# Patient Record
Sex: Female | Born: 1955 | Race: White | Hispanic: No | Marital: Single | Smoking: Former smoker
Health system: Southern US, Community
[De-identification: ages and names within clinical notes are randomized; demographics above are authoritative.]

## PROBLEM LIST (undated history)

## (undated) DIAGNOSIS — I219 Acute myocardial infarction, unspecified: Secondary | ICD-10-CM

## (undated) DIAGNOSIS — K219 Gastro-esophageal reflux disease without esophagitis: Secondary | ICD-10-CM

## (undated) DIAGNOSIS — I639 Cerebral infarction, unspecified: Secondary | ICD-10-CM

## (undated) DIAGNOSIS — I701 Atherosclerosis of renal artery: Secondary | ICD-10-CM

## (undated) DIAGNOSIS — I2510 Atherosclerotic heart disease of native coronary artery without angina pectoris: Secondary | ICD-10-CM

## (undated) DIAGNOSIS — I10 Essential (primary) hypertension: Secondary | ICD-10-CM

## (undated) HISTORY — PX: BREAST BIOPSY: SHX20

---

## 2010-03-18 ENCOUNTER — Emergency Department (HOSPITAL_BASED_OUTPATIENT_CLINIC_OR_DEPARTMENT_OTHER): Admission: EM | Admit: 2010-03-18 | Discharge: 2010-03-18 | Payer: Self-pay | Admitting: Emergency Medicine

## 2010-03-18 ENCOUNTER — Ambulatory Visit: Payer: Self-pay | Admitting: Interventional Radiology

## 2010-04-10 ENCOUNTER — Ambulatory Visit: Payer: Self-pay | Admitting: Diagnostic Radiology

## 2010-04-10 ENCOUNTER — Inpatient Hospital Stay (HOSPITAL_COMMUNITY): Admission: EM | Admit: 2010-04-10 | Discharge: 2010-04-12 | Payer: Self-pay | Admitting: Internal Medicine

## 2010-04-10 ENCOUNTER — Encounter: Payer: Self-pay | Admitting: Emergency Medicine

## 2010-04-10 ENCOUNTER — Ambulatory Visit: Payer: Self-pay | Admitting: Cardiology

## 2010-07-17 ENCOUNTER — Emergency Department (HOSPITAL_BASED_OUTPATIENT_CLINIC_OR_DEPARTMENT_OTHER)
Admission: EM | Admit: 2010-07-17 | Discharge: 2010-07-17 | Payer: Self-pay | Source: Home / Self Care | Admitting: Emergency Medicine

## 2010-08-30 LAB — LIPID PANEL
Cholesterol: 164 mg/dL (ref 0–200)
HDL: 55 mg/dL (ref >39)
LDL Cholesterol: 85 mg/dL (ref 0–99)
Total CHOL/HDL Ratio: 3 RATIO
Triglycerides: 122 mg/dL (ref <150)
VLDL: 24 mg/dL (ref 0–40)

## 2010-08-30 LAB — COMPREHENSIVE METABOLIC PANEL WITH GFR
ALT: 19 U/L (ref 0–35)
AST: 20 U/L (ref 0–37)
Albumin: 4.4 g/dL (ref 3.5–5.2)
Alkaline Phosphatase: 86 U/L (ref 39–117)
BUN: 24 mg/dL — ABNORMAL HIGH (ref 6–23)
CO2: 26 meq/L (ref 19–32)
Calcium: 10 mg/dL (ref 8.4–10.5)
Chloride: 104 meq/L (ref 96–112)
Creatinine, Ser: 1.1 mg/dL (ref 0.4–1.2)
GFR calc non Af Amer: 52 mL/min — ABNORMAL LOW
Glucose, Bld: 91 mg/dL (ref 70–99)
Potassium: 4.3 meq/L (ref 3.5–5.1)
Sodium: 142 meq/L (ref 135–145)
Total Bilirubin: 0.6 mg/dL (ref 0.3–1.2)
Total Protein: 7.1 g/dL (ref 6.0–8.3)

## 2010-08-30 LAB — BASIC METABOLIC PANEL
BUN: 24 mg/dL — ABNORMAL HIGH (ref 6–23)
CO2: 26 mEq/L (ref 19–32)
Calcium: 9.6 mg/dL (ref 8.4–10.5)
Chloride: 104 mEq/L (ref 96–112)
Creatinine, Ser: 1.16 mg/dL (ref 0.4–1.2)
GFR calc Af Amer: 59 mL/min — ABNORMAL LOW (ref >60)
GFR calc non Af Amer: 49 mL/min — ABNORMAL LOW (ref >60)
Glucose, Bld: 101 mg/dL — ABNORMAL HIGH (ref 70–99)
Potassium: 3.9 mEq/L (ref 3.5–5.1)
Sodium: 140 mEq/L (ref 135–145)

## 2010-08-30 LAB — CARDIAC PANEL(CRET KIN+CKTOT+MB+TROPI)
CK, MB: 0.8 ng/mL (ref 0.3–4.0)
CK, MB: 0.8 ng/mL (ref 0.3–4.0)
CK, MB: 0.8 ng/mL (ref 0.3–4.0)
Relative Index: INVALID (ref 0.0–2.5)
Relative Index: INVALID (ref 0.0–2.5)
Relative Index: INVALID (ref 0.0–2.5)
Total CK: 35 U/L (ref 7–177)
Total CK: 39 U/L (ref 7–177)
Total CK: 41 U/L (ref 7–177)
Troponin I: <0.01 ng/mL (ref 0.00–0.06)
Troponin I: <0.01 ng/mL (ref 0.00–0.06)

## 2010-08-30 LAB — POCT CARDIAC MARKERS
CKMB, poc: <1 ng/mL — ABNORMAL LOW (ref 1.0–8.0)
Myoglobin, poc: 43.3 ng/mL (ref 12–200)
Troponin i, poc: <0.05 ng/mL (ref 0.00–0.09)

## 2010-08-30 LAB — LIPASE, BLOOD: Lipase: 113 U/L (ref 23–300)

## 2010-08-30 LAB — CBC
HCT: 36.6 % (ref 36.0–46.0)
HCT: 38.5 % (ref 36.0–46.0)
Hemoglobin: 12.2 g/dL (ref 12.0–15.0)
Hemoglobin: 12.8 g/dL (ref 12.0–15.0)
MCH: 29.3 pg (ref 26.0–34.0)
MCH: 30.5 pg (ref 26.0–34.0)
MCHC: 33.3 g/dL (ref 30.0–36.0)
MCHC: 33.3 g/dL (ref 30.0–36.0)
MCV: 87.8 fL (ref 78.0–100.0)
MCV: 91.6 fL (ref 78.0–100.0)
Platelets: 163 10*3/uL (ref 150–400)
Platelets: 164 10*3/uL (ref 150–400)
RBC: 4.17 MIL/uL (ref 3.87–5.11)
RBC: 4.2 MIL/uL (ref 3.87–5.11)
RDW: 12.1 % (ref 11.5–15.5)
RDW: 12.7 % (ref 11.5–15.5)
WBC: 5.4 10*3/uL (ref 4.0–10.5)
WBC: 6.1 10*3/uL (ref 4.0–10.5)

## 2010-08-30 LAB — DIFFERENTIAL
Basophils Absolute: 0 10*3/uL (ref 0.0–0.1)
Basophils Relative: 1 % (ref 0–1)
Eosinophils Absolute: 0.1 10*3/uL (ref 0.0–0.7)
Eosinophils Relative: 2 % (ref 0–5)
Lymphocytes Relative: 38 % (ref 12–46)
Lymphs Abs: 2.1 10*3/uL (ref 0.7–4.0)
Monocytes Absolute: 0.2 10*3/uL (ref 0.1–1.0)
Monocytes Relative: 4 % (ref 3–12)
Neutro Abs: 3 10*3/uL (ref 1.7–7.7)
Neutrophils Relative %: 55 % (ref 43–77)

## 2010-08-30 LAB — HEMOGLOBIN A1C
Hgb A1c MFr Bld: 5.6 % (ref <5.7)
Mean Plasma Glucose: 114 mg/dL (ref <117)

## 2010-08-30 LAB — MRSA PCR SCREENING: MRSA by PCR: NEGATIVE

## 2010-08-31 LAB — DIFFERENTIAL
Basophils Absolute: 0 10*3/uL (ref 0.0–0.1)
Basophils Relative: 0 % (ref 0–1)
Eosinophils Absolute: 0.2 10*3/uL (ref 0.0–0.7)
Eosinophils Relative: 4 % (ref 0–5)
Lymphocytes Relative: 25 % (ref 12–46)
Lymphs Abs: 1.4 10*3/uL (ref 0.7–4.0)
Monocytes Absolute: 0.3 10*3/uL (ref 0.1–1.0)
Monocytes Relative: 6 % (ref 3–12)
Neutro Abs: 3.6 10*3/uL (ref 1.7–7.7)
Neutrophils Relative %: 65 % (ref 43–77)

## 2010-08-31 LAB — BASIC METABOLIC PANEL
BUN: 19 mg/dL (ref 6–23)
CO2: 29 mEq/L (ref 19–32)
Calcium: 10.1 mg/dL (ref 8.4–10.5)
Chloride: 105 mEq/L (ref 96–112)
Creatinine, Ser: 1 mg/dL (ref 0.4–1.2)
GFR calc Af Amer: >60 mL/min (ref >60)
GFR calc non Af Amer: 58 mL/min — ABNORMAL LOW (ref >60)
Glucose, Bld: 135 mg/dL — ABNORMAL HIGH (ref 70–99)
Potassium: 4.3 mEq/L (ref 3.5–5.1)
Sodium: 142 mEq/L (ref 135–145)

## 2010-08-31 LAB — CBC
HCT: 39.2 % (ref 36.0–46.0)
Hemoglobin: 13.6 g/dL (ref 12.0–15.0)
MCH: 30.8 pg (ref 26.0–34.0)
MCHC: 34.6 g/dL (ref 30.0–36.0)
MCV: 88.9 fL (ref 78.0–100.0)
Platelets: 167 10*3/uL (ref 150–400)
RBC: 4.41 MIL/uL (ref 3.87–5.11)
RDW: 12.3 % (ref 11.5–15.5)
WBC: 5.5 10*3/uL (ref 4.0–10.5)

## 2010-10-28 ENCOUNTER — Emergency Department (HOSPITAL_BASED_OUTPATIENT_CLINIC_OR_DEPARTMENT_OTHER)
Admission: EM | Admit: 2010-10-28 | Discharge: 2010-10-28 | Disposition: A | Discharge status: Home / Self Care | Payer: Medicaid Other | Attending: Emergency Medicine | Admitting: Emergency Medicine

## 2010-10-28 ENCOUNTER — Emergency Department (INDEPENDENT_AMBULATORY_CARE_PROVIDER_SITE_OTHER): Payer: Medicaid Other

## 2010-10-28 DIAGNOSIS — K219 Gastro-esophageal reflux disease without esophagitis: Secondary | ICD-10-CM | POA: Insufficient documentation

## 2010-10-28 DIAGNOSIS — M7989 Other specified soft tissue disorders: Secondary | ICD-10-CM

## 2010-10-28 DIAGNOSIS — M79609 Pain in unspecified limb: Secondary | ICD-10-CM

## 2010-10-28 DIAGNOSIS — Z79899 Other long term (current) drug therapy: Secondary | ICD-10-CM | POA: Insufficient documentation

## 2010-10-28 DIAGNOSIS — I2510 Atherosclerotic heart disease of native coronary artery without angina pectoris: Secondary | ICD-10-CM | POA: Insufficient documentation

## 2010-10-28 DIAGNOSIS — M949 Disorder of cartilage, unspecified: Secondary | ICD-10-CM

## 2010-10-28 DIAGNOSIS — L02519 Cutaneous abscess of unspecified hand: Secondary | ICD-10-CM | POA: Insufficient documentation

## 2010-11-05 ENCOUNTER — Emergency Department (INDEPENDENT_AMBULATORY_CARE_PROVIDER_SITE_OTHER): Payer: Medicaid Other

## 2010-11-05 ENCOUNTER — Emergency Department (HOSPITAL_BASED_OUTPATIENT_CLINIC_OR_DEPARTMENT_OTHER)
Admission: EM | Admit: 2010-11-05 | Discharge: 2010-11-05 | Disposition: A | Discharge status: Home / Self Care | Payer: Medicaid Other | Attending: Emergency Medicine | Admitting: Emergency Medicine

## 2010-11-05 DIAGNOSIS — Z8679 Personal history of other diseases of the circulatory system: Secondary | ICD-10-CM | POA: Insufficient documentation

## 2010-11-05 DIAGNOSIS — K219 Gastro-esophageal reflux disease without esophagitis: Secondary | ICD-10-CM | POA: Insufficient documentation

## 2010-11-05 DIAGNOSIS — M7989 Other specified soft tissue disorders: Secondary | ICD-10-CM

## 2010-11-05 DIAGNOSIS — L03119 Cellulitis of unspecified part of limb: Secondary | ICD-10-CM | POA: Insufficient documentation

## 2010-11-05 DIAGNOSIS — I2510 Atherosclerotic heart disease of native coronary artery without angina pectoris: Secondary | ICD-10-CM | POA: Insufficient documentation

## 2010-11-05 DIAGNOSIS — L02519 Cutaneous abscess of unspecified hand: Secondary | ICD-10-CM | POA: Insufficient documentation

## 2010-11-05 DIAGNOSIS — Z79899 Other long term (current) drug therapy: Secondary | ICD-10-CM | POA: Insufficient documentation

## 2010-11-05 DIAGNOSIS — M79609 Pain in unspecified limb: Secondary | ICD-10-CM

## 2010-11-05 LAB — CBC
HCT: 35.9 % — ABNORMAL LOW (ref 36.0–46.0)
Hemoglobin: 11.8 g/dL — ABNORMAL LOW (ref 12.0–15.0)
MCH: 28 pg (ref 26.0–34.0)
MCHC: 32.9 g/dL (ref 30.0–36.0)
MCV: 85.3 fL (ref 78.0–100.0)
Platelets: 189 10*3/uL (ref 150–400)
RBC: 4.21 MIL/uL (ref 3.87–5.11)
RDW: 13 % (ref 11.5–15.5)
WBC: 6.2 10*3/uL (ref 4.0–10.5)

## 2010-11-05 LAB — DIFFERENTIAL
Basophils Relative: 0 % (ref 0–1)
Eosinophils Absolute: 0.1 10*3/uL (ref 0.0–0.7)
Eosinophils Relative: 2 % (ref 0–5)
Lymphocytes Relative: 27 % (ref 12–46)
Lymphs Abs: 1.6 10*3/uL (ref 0.7–4.0)
Monocytes Absolute: 0.4 10*3/uL (ref 0.1–1.0)
Monocytes Relative: 6 % (ref 3–12)
Neutro Abs: 4 10*3/uL (ref 1.7–7.7)

## 2010-11-05 LAB — BASIC METABOLIC PANEL
BUN: 12 mg/dL (ref 6–23)
CO2: 28 mEq/L (ref 19–32)
Calcium: 10.4 mg/dL (ref 8.4–10.5)
Chloride: 101 mEq/L (ref 96–112)
Creatinine, Ser: 0.8 mg/dL (ref 0.4–1.2)
GFR calc Af Amer: >60 mL/min (ref >60)
GFR calc non Af Amer: >60 mL/min (ref >60)
Glucose, Bld: 94 mg/dL (ref 70–99)
Potassium: 4 mEq/L (ref 3.5–5.1)
Sodium: 140 mEq/L (ref 135–145)

## 2011-01-02 ENCOUNTER — Encounter: Payer: Self-pay | Admitting: *Deleted

## 2011-01-02 ENCOUNTER — Emergency Department (HOSPITAL_BASED_OUTPATIENT_CLINIC_OR_DEPARTMENT_OTHER)
Admission: EM | Admit: 2011-01-02 | Discharge: 2011-01-02 | Disposition: A | Discharge status: Other Acute Inpatient Hospital | Payer: Medicaid Other | Attending: Emergency Medicine | Admitting: Emergency Medicine

## 2011-01-02 DIAGNOSIS — I10 Essential (primary) hypertension: Secondary | ICD-10-CM | POA: Insufficient documentation

## 2011-01-02 DIAGNOSIS — R5383 Other fatigue: Secondary | ICD-10-CM | POA: Insufficient documentation

## 2011-01-02 DIAGNOSIS — K219 Gastro-esophageal reflux disease without esophagitis: Secondary | ICD-10-CM | POA: Insufficient documentation

## 2011-01-02 DIAGNOSIS — R5381 Other malaise: Secondary | ICD-10-CM | POA: Insufficient documentation

## 2011-01-02 DIAGNOSIS — I2510 Atherosclerotic heart disease of native coronary artery without angina pectoris: Secondary | ICD-10-CM | POA: Insufficient documentation

## 2011-01-02 DIAGNOSIS — I959 Hypotension, unspecified: Secondary | ICD-10-CM | POA: Insufficient documentation

## 2011-01-02 DIAGNOSIS — Z8673 Personal history of transient ischemic attack (TIA), and cerebral infarction without residual deficits: Secondary | ICD-10-CM | POA: Insufficient documentation

## 2011-01-02 HISTORY — DX: Essential (primary) hypertension: I10

## 2011-01-02 HISTORY — DX: Gastro-esophageal reflux disease without esophagitis: K21.9

## 2011-01-02 HISTORY — DX: Atherosclerotic heart disease of native coronary artery without angina pectoris: I25.10

## 2011-01-02 HISTORY — DX: Cerebral infarction, unspecified: I63.9

## 2011-01-02 LAB — CBC
HCT: 34.8 % — ABNORMAL LOW (ref 36.0–46.0)
MCHC: 33 g/dL (ref 30.0–36.0)
MCV: 84.5 fL (ref 78.0–100.0)
RDW: 14.4 % (ref 11.5–15.5)

## 2011-01-02 LAB — BASIC METABOLIC PANEL
BUN: 23 mg/dL (ref 6–23)
Creatinine, Ser: 1.3 mg/dL — ABNORMAL HIGH (ref 0.50–1.10)
GFR calc Af Amer: 51 mL/min — ABNORMAL LOW (ref >60)
GFR calc non Af Amer: 43 mL/min — ABNORMAL LOW (ref >60)

## 2011-01-02 LAB — URINALYSIS, ROUTINE W REFLEX MICROSCOPIC
Bilirubin Urine: NEGATIVE
Ketones, ur: NEGATIVE mg/dL
Nitrite: NEGATIVE
Protein, ur: NEGATIVE mg/dL
Urobilinogen, UA: 1 mg/dL (ref 0.0–1.0)

## 2011-01-02 NOTE — ED Provider Notes (Signed)
History     Chief Complaint  Patient presents with  . Fatigue   Patient is a 55 y.o. female presenting with general illness. The history is provided by the patient, the nursing home and the EMS personnel.  Illness  The current episode started today. The onset was gradual. The problem occurs rarely. The problem has been resolved. The problem is mild. The symptoms are relieved by nothing. The symptoms are aggravated by nothing. Associated symptoms include vomiting. Pertinent negatives include no fever, no decreased vision, no double vision, no abdominal pain, no nausea, no headaches, no muscle aches and no neck pain. She has been behaving normally. She has been eating and drinking normally.  Pt reports she felt bad and the Nurse at the Nursing home took her blood pressure and told her it was low.  Pt was sent here for evaluation.  Pt reports she feels normal now.  Blood pressure normal here.  Past Medical History  Diagnosis Date  . Coronary artery disease   . Stroke   . GERD (gastroesophageal reflux disease)   . Hypertension     History reviewed. No pertinent past surgical history.  History reviewed. No pertinent family history.  History  Substance Use Topics  . Smoking status: Never Smoker   . Smokeless tobacco: Not on file  . Alcohol Use: No    OB History    Grav Para Term Preterm Abortions TAB SAB Ect Mult Living                  Review of Systems  Constitutional: Negative for fever.  HENT: Negative for neck pain.   Eyes: Negative for double vision.  Gastrointestinal: Positive for vomiting. Negative for nausea and abdominal pain.  Neurological: Negative for headaches.  All other systems reviewed and are negative.    Physical Exam  BP 119/74  Pulse 70  Temp(Src) 98.8 F (37.1 C) (Oral)  Resp 24  SpO2 95%  Physical Exam  Constitutional: She is oriented to person, place, and time. She appears well-developed and well-nourished.  HENT:  Head: Normocephalic.    Eyes: Conjunctivae and EOM are normal. Pupils are equal, round, and reactive to light.  Neck: Normal range of motion. Neck supple.  Cardiovascular: Normal rate.   Pulmonary/Chest: Effort normal and breath sounds normal.  Abdominal: Soft.  Neurological: She is oriented to person, place, and time. No cranial nerve deficit.  Skin: Skin is warm.  Psychiatric: She has a normal mood and affect.    ED Course  Procedures  MDM Creat is slightly elevated, hemoglobin 11.8.  Pt's blood pressure normal here.  I advised have RN monitor blood pressure,  Recheck with primary Md in 2-3 days.      Langston Masker, [place] 01/02/11 1940  Langston Masker, [place] 01/02/11 1942  Langston Masker, [place] 01/02/11 2029

## 2011-01-02 NOTE — ED Notes (Signed)
Attempted several times to [facility] to give report , no answer

## 2011-01-02 NOTE — ED Notes (Signed)
Pt sent from [facility] for eval weakness ? Hypotension.

## 2011-01-02 NOTE — ED Notes (Signed)
Urine specimen obtained and placed in lab if needed.

## 2011-01-04 NOTE — ED Provider Notes (Signed)
Evaluation and management procedures were performed by the PA/NP under my supervision/collaboration.   Sharalee Witman, MD 01/04/11 1505 

## 2011-05-05 ENCOUNTER — Emergency Department (INDEPENDENT_AMBULATORY_CARE_PROVIDER_SITE_OTHER): Payer: Medicare Other

## 2011-05-05 ENCOUNTER — Encounter (HOSPITAL_BASED_OUTPATIENT_CLINIC_OR_DEPARTMENT_OTHER): Payer: Self-pay | Admitting: *Deleted

## 2011-05-05 ENCOUNTER — Emergency Department (HOSPITAL_BASED_OUTPATIENT_CLINIC_OR_DEPARTMENT_OTHER)
Admission: EM | Admit: 2011-05-05 | Discharge: 2011-05-05 | Disposition: A | Discharge status: Home / Self Care | Payer: Medicare Other | Attending: Emergency Medicine | Admitting: Emergency Medicine

## 2011-05-05 DIAGNOSIS — R05 Cough: Secondary | ICD-10-CM

## 2011-05-05 DIAGNOSIS — I10 Essential (primary) hypertension: Secondary | ICD-10-CM | POA: Insufficient documentation

## 2011-05-05 DIAGNOSIS — R0989 Other specified symptoms and signs involving the circulatory and respiratory systems: Secondary | ICD-10-CM

## 2011-05-05 DIAGNOSIS — I2510 Atherosclerotic heart disease of native coronary artery without angina pectoris: Secondary | ICD-10-CM | POA: Insufficient documentation

## 2011-05-05 DIAGNOSIS — Z8679 Personal history of other diseases of the circulatory system: Secondary | ICD-10-CM | POA: Insufficient documentation

## 2011-05-05 DIAGNOSIS — J40 Bronchitis, not specified as acute or chronic: Secondary | ICD-10-CM | POA: Insufficient documentation

## 2011-05-05 DIAGNOSIS — Z79899 Other long term (current) drug therapy: Secondary | ICD-10-CM | POA: Insufficient documentation

## 2011-05-05 HISTORY — DX: Acute myocardial infarction, unspecified: I21.9

## 2011-05-05 HISTORY — DX: Atherosclerosis of renal artery: I70.1

## 2011-05-05 MED ORDER — ALBUTEROL SULFATE HFA 108 (90 BASE) MCG/ACT IN AERS
2.0000 | INHALATION_SPRAY | Freq: Four times a day (QID) | RESPIRATORY_TRACT | Status: DC | PRN
  Filled 2011-05-05: qty 6.7

## 2011-05-05 MED ORDER — IPRATROPIUM BROMIDE 0.02 % IN SOLN
0.5000 mg | Freq: Once | RESPIRATORY_TRACT | Status: AC
  Administered 2011-05-05: 0.5 mg via RESPIRATORY_TRACT
  Filled 2011-05-05: qty 2.5

## 2011-05-05 MED ORDER — MOXIFLOXACIN HCL 400 MG PO TABS: 400.0000 mg | ORAL_TABLET | Freq: Every day | ORAL | Status: AC

## 2011-05-05 MED ORDER — ALBUTEROL SULFATE (5 MG/ML) 0.5% IN NEBU
2.5000 mg | INHALATION_SOLUTION | Freq: Once | RESPIRATORY_TRACT | Status: AC
  Administered 2011-05-05: 2.5 mg via RESPIRATORY_TRACT
  Filled 2011-05-05: qty 0.5

## 2011-05-05 NOTE — ED Notes (Signed)
Pt describes cough and congestion x 2 days. BBS diminished. Mild SOB.

## 2011-05-05 NOTE — ED Provider Notes (Signed)
Medical screening examination/treatment/procedure(s) were performed by non-physician practitioner and as supervising physician I was immediately available for consultation/collaboration.    Celene Kras, MD 05/05/11 [phone]

## 2011-05-05 NOTE — ED Provider Notes (Signed)
History     CSN: [id number] Arrival date & time: 05/05/2011  2:15 PM   First MD Initiated Contact with Patient 05/05/11 1421      Chief Complaint  Patient presents with  . Cough    (Consider location/radiation/quality/duration/timing/severity/associated sxs/prior treatment) Patient is a 55 y.o. female presenting with cough and shortness of breath. The history is provided by the patient and a relative.  Cough This is a chronic problem. The current episode started 2 days ago. The problem occurs constantly. The problem has been gradually worsening. The cough is non-productive. Associated symptoms include shortness of breath. Pertinent negatives include no chest pain, no rhinorrhea, no sore throat and no wheezing. Associated symptoms comments: Back pain from coughing.. She has tried nothing for the symptoms. She is a smoker.  Shortness of Breath  The current episode started 2 days ago. The onset was gradual. The problem occurs frequently. The problem has been gradually worsening. The problem is moderate. The symptoms are relieved by nothing. The symptoms are aggravated by nothing. Associated symptoms include cough and shortness of breath. Pertinent negatives include no chest pain, no chest pressure, no orthopnea, no fever, no rhinorrhea, no sore throat, no stridor and no wheezing. She is currently using steroids. She has received no recent medical care.    Past Medical History  Diagnosis Date  . Coronary artery disease   . Stroke   . GERD (gastroesophageal reflux disease)   . Hypertension     History reviewed. No pertinent past surgical history.  No family history on file.  History  Substance Use Topics  . Smoking status: Never Smoker   . Smokeless tobacco: Not on file  . Alcohol Use: No    OB History    Grav Para Term Preterm Abortions TAB SAB Ect Mult Living                  Review of Systems  Constitutional: Negative.  Negative for fever.  HENT: Positive for  congestion. Negative for sore throat and rhinorrhea.   Eyes: Negative.   Respiratory: Positive for cough and shortness of breath. Negative for wheezing and stridor.   Cardiovascular: Negative.  Negative for chest pain and orthopnea.  Gastrointestinal: Negative.   Genitourinary: Negative.   Musculoskeletal: Positive for back pain.  Neurological: Positive for speech difficulty.       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  Hematological: Negative.   Psychiatric/Behavioral: Negative.     Allergies  Review of patient's allergies indicates no known allergies.  Home Medications   Current Outpatient Rx  Name Route Sig Dispense Refill  . ACETAMINOPHEN 325 MG PO TABS Oral Take 650 mg by mouth every 8 (eight) hours as needed. Pain/fever     . ALUM & MAG HYDROXIDE-SIMETH 500-450-40 MG/5ML PO SUSP Oral Take by mouth every 6 (six) hours as needed.      Marland Kitchen ALUMINUM-MAGNESIUM-SIMETHICONE 200-200-20 MG/5ML PO SUSP Oral Take 10-20 mLs by mouth 4 (four) times daily -  before meals and at bedtime. indigestion     . CARVEDILOL 3.125 MG PO TABS Oral Take 3.125 mg by mouth 2 (two) times daily with a meal.      . CLOPIDOGREL BISULFATE 75 MG PO TABS Oral Take 75 mg by mouth daily.      Marland Kitchen HYDROCODONE-ACETAMINOPHEN 5-325 MG PO TABS Oral Take 1 tablet by mouth every 4 (four) hours as needed. pain    . LISINOPRIL 20 MG PO TABS Oral Take 20 mg by  mouth daily.      . MULTI-VITAMIN/MINERALS PO TABS Oral Take 1 tablet by mouth daily.      Marland Kitchen PANTOPRAZOLE SODIUM 40 MG PO TBEC Oral Take 40 mg by mouth daily.      Marland Kitchen POTASSIUM CHLORIDE 10 MEQ PO TBCR Oral Take 10 mEq by mouth daily.      Marland Kitchen PRAVASTATIN SODIUM 40 MG PO TABS Oral Take 40 mg by mouth daily.      . SERTRALINE HCL 50 MG PO TABS Oral Take 50 mg by mouth daily.        BP 160/76  Pulse 94  Temp(Src) 97.8 F (36.6 C) (Oral)  Resp 20  Ht 5\' 2"  (1.575 m)  Wt 133 lb (60.328 kg)  BMI 24.33 kg/m2  SpO2 100%  Physical Exam    Constitutional: She is oriented to person, place, and time. She appears well-developed and well-nourished.  HENT:  Head: Normocephalic and atraumatic.  Eyes: Conjunctivae and EOM are normal. Pupils are equal, round, and reactive to light.  Neck: Normal range of motion. Neck supple.  Cardiovascular: Normal rate, regular rhythm and normal heart sounds.   Pulmonary/Chest: Effort normal. No respiratory distress. She has no wheezes. She has no rales. She exhibits no tenderness.       Dec. BS throughout bilateral bases.  Abdominal: Soft. Bowel sounds are normal. She exhibits no distension. There is no tenderness.  Musculoskeletal: Normal range of motion.  Lymphadenopathy:    She has no cervical adenopathy.  Neurological: She is alert and oriented to person, place, and time.       Difficulty with speech, residual effect of two previous strokes.  Skin: Skin is warm and dry. No rash noted. No erythema. No pallor.  Psychiatric: She has a normal mood and affect. Her behavior is normal.    ED Course  Procedures (including critical care time)  Labs Reviewed - No data to display No results found.   No diagnosis found.  I've reviewed CXR with patient showing right sided airspace disease.  Patient sob improving with albuterol/atrovent nebulizer.  No chest pain.  Continued persistent cough.  MDM  Pt c/o cough and nasal congestion.  She denies any fever or chest pain.  She has extensive cardiac history.  She states this sx are different.  She is currently on prednisone but her sister is unsure why.  She unfortunately smokes >1/2 ppd.  Treat with albuterol inhaler for cough and Avelox for bronchitis.  If symptoms worsen or persist, advised to return to ED.  Pt will f/u with pcp next week for recheck.      Rodena Medin, PA 05/05/11 [phone]

## 2016-07-19 HISTORY — PX: JOINT REPLACEMENT: SHX530

## 2016-11-10 ENCOUNTER — Emergency Department (HOSPITAL_COMMUNITY)
Admission: EM | Admit: 2016-11-10 | Discharge: 2016-11-11 | Disposition: A | Discharge status: Home / Self Care | Payer: Medicare Other | Attending: Emergency Medicine | Admitting: Emergency Medicine

## 2016-11-10 ENCOUNTER — Encounter (HOSPITAL_COMMUNITY): Payer: Self-pay

## 2016-11-10 ENCOUNTER — Emergency Department (HOSPITAL_COMMUNITY): Payer: Medicare Other

## 2016-11-10 DIAGNOSIS — I2510 Atherosclerotic heart disease of native coronary artery without angina pectoris: Secondary | ICD-10-CM | POA: Diagnosis not present

## 2016-11-10 DIAGNOSIS — I639 Cerebral infarction, unspecified: Secondary | ICD-10-CM | POA: Insufficient documentation

## 2016-11-10 DIAGNOSIS — I219 Acute myocardial infarction, unspecified: Secondary | ICD-10-CM | POA: Diagnosis not present

## 2016-11-10 DIAGNOSIS — Y999 Unspecified external cause status: Secondary | ICD-10-CM | POA: Insufficient documentation

## 2016-11-10 DIAGNOSIS — Z7902 Long term (current) use of antithrombotics/antiplatelets: Secondary | ICD-10-CM | POA: Insufficient documentation

## 2016-11-10 DIAGNOSIS — Y929 Unspecified place or not applicable: Secondary | ICD-10-CM | POA: Insufficient documentation

## 2016-11-10 DIAGNOSIS — Z79899 Other long term (current) drug therapy: Secondary | ICD-10-CM | POA: Diagnosis not present

## 2016-11-10 DIAGNOSIS — Y939 Activity, unspecified: Secondary | ICD-10-CM | POA: Insufficient documentation

## 2016-11-10 DIAGNOSIS — I10 Essential (primary) hypertension: Secondary | ICD-10-CM | POA: Diagnosis not present

## 2016-11-10 DIAGNOSIS — F17200 Nicotine dependence, unspecified, uncomplicated: Secondary | ICD-10-CM | POA: Insufficient documentation

## 2016-11-10 DIAGNOSIS — S62111A Displaced fracture of triquetrum [cuneiform] bone, right wrist, initial encounter for closed fracture: Secondary | ICD-10-CM

## 2016-11-10 DIAGNOSIS — W010XXA Fall on same level from slipping, tripping and stumbling without subsequent striking against object, initial encounter: Secondary | ICD-10-CM | POA: Diagnosis not present

## 2016-11-10 DIAGNOSIS — S6991XA Unspecified injury of right wrist, hand and finger(s), initial encounter: Secondary | ICD-10-CM | POA: Diagnosis present

## 2016-11-10 DIAGNOSIS — S62112A Displaced fracture of triquetrum [cuneiform] bone, left wrist, initial encounter for closed fracture: Secondary | ICD-10-CM | POA: Insufficient documentation

## 2016-11-10 NOTE — ED Triage Notes (Signed)
Pt from [facility] with [facility] c.o witnessed fall. Pt was walking with walker when she tripped and fell on her right arm. No LOC and did not hit her head. Old bruising noted to right hand, pt c.o pain 8/10. Pt a.o, VSS nad

## 2016-11-10 NOTE — ED Notes (Signed)
Patient transported to X-ray 

## 2016-11-10 NOTE — ED Provider Notes (Signed)
[facility]-EMERGENCY DEPT Provider Note   CSN: [id number] Arrival date & time: 11/10/16  2255     History   Chief Complaint Chief Complaint  Patient presents with  . Fall    HPI Sandra Rice is a 61 y.o. female.  HPI Patient presents to the emergency room for evaluation of a right hand injury. Patient states she fell today injuring her right hand. She did not hit her head or lose consciousness. She denies any other injuries.  Patient denies any numbness or weakness. Past Medical History:  Diagnosis Date  . Coronary artery disease   . GERD (gastroesophageal reflux disease)   . Hypertension   . Myocardial infarction (HCC)   . Renal artery stenosis (HCC)   . Stroke [facility])     There are no active problems to display for this patient.   History reviewed. No pertinent surgical history.  OB History    No data available       Home Medications    Prior to Admission medications   Medication Sig Start Date End Date Taking? Authorizing Provider  acetaminophen (TYLENOL) 325 MG tablet Take 650 mg by mouth every 8 (eight) hours as needed. Pain/fever     [provider]  aluminum & magnesium hydroxide-simethicone (MYLANTA) 500-450-40 MG/5ML suspension Take by mouth every 6 (six) hours as needed.      [provider]  aluminum-magnesium hydroxide-simethicone (MAALOX) 200-200-20 MG/5ML SUSP Take 10-20 mLs by mouth 4 (four) times daily -  before meals and at bedtime. indigestion     [provider]  carvedilol (COREG) 3.125 MG tablet Take 3.125 mg by mouth 2 (two) times daily with a meal.      [provider]  clopidogrel (PLAVIX) 75 MG tablet Take 75 mg by mouth daily.      [provider]  HYDROcodone-acetaminophen (NORCO) 5-325 MG per tablet Take 1 tablet by mouth every 4 (four) hours as needed. pain    [provider]  lisinopril (PRINIVIL,ZESTRIL) 20 MG tablet Take 20 mg by mouth daily.      [provider]  Multiple  Vitamins-Minerals (MULTIVITAMIN WITH MINERALS) tablet Take 1 tablet by mouth daily.      [provider]  pantoprazole (PROTONIX) 40 MG tablet Take 40 mg by mouth daily.      [provider]  potassium chloride (KLOR-CON) 10 MEQ CR tablet Take 10 mEq by mouth daily.      [provider]  pravastatin (PRAVACHOL) 40 MG tablet Take 40 mg by mouth daily.      [provider]  sertraline (ZOLOFT) 50 MG tablet Take 50 mg by mouth daily.      [provider]    Family History No family history on file.  Social History Social History  Substance Use Topics  . Smoking status: Current Every Day Smoker    Packs/day: 0.50  . Smokeless tobacco: Not on file  . Alcohol use No     Allergies   Patient has no known allergies.   Review of Systems Review of Systems  All other systems reviewed and are negative.    Physical Exam Updated Vital Signs BP 127/69   Pulse 77   Temp 97.7 F (36.5 C) (Oral)   Resp 16   Wt 72.6 kg (160 lb)   SpO2 99%   BMI 29.26 kg/m   Physical Exam  Constitutional: She appears well-developed and well-nourished. No distress.  HENT:  Head: Normocephalic and atraumatic.  Right Ear:  External ear normal.  Left Ear: External ear normal.  Eyes: Conjunctivae are normal. Right eye exhibits no discharge. Left eye exhibits no discharge. No scleral icterus.  Neck: Neck supple. No tracheal deviation present.  Cardiovascular: Normal rate.   Pulmonary/Chest: Effort normal. No stridor. No respiratory distress.  Abdominal: She exhibits no distension.  Musculoskeletal: She exhibits no edema.       Right hand: She exhibits tenderness and bony tenderness.  Diffuse Ecchymoses noted on the dorsal aspect of the right hand, tenderness to palpation of the carpal, metacarpal and phalanges, mild tenderness to palpation of the wrist  Neurological: She is alert. Cranial nerve deficit: no gross deficits.  Skin: Skin is warm and dry. No rash  noted.  Psychiatric: She has a normal mood and affect.  Nursing note and vitals reviewed.    ED Treatments / Results  Labs (all labs ordered are listed, but only abnormal results are displayed) Labs Reviewed - No data to display  EKG  EKG Interpretation None       Radiology Dg Wrist Complete Right  Result Date: 11/11/2016 CLINICAL DATA:  Pt states she shut her right hand in the bathroom door "several days ago", then fell today. Right hand and wrist pain, swelling and bruising. EXAM: RIGHT WRIST - COMPLETE 3+ VIEW COMPARISON:  None. FINDINGS: Diffuse bone demineralization. Degenerative changes in the radiocarpal and STT joints. Bone fragment over the dorsum of the right wrist likely representing a triquetrum fracture. Dorsal soft tissue swelling. Vascular calcifications. IMPRESSION: Bone fragment over the dorsum of the wrist likely representing a triquetrum fracture. Soft tissue swelling. Electronically Signed   By: Burman Nieves M.D.   On: 11/11/2016 00:30   Dg Hand Complete Right  Result Date: 11/11/2016 CLINICAL DATA:  Pt states she shut her right hand in the bathroom door "several days ago", then fell today. Right hand and wrist pain, swelling and bruising. EXAM: RIGHT HAND - COMPLETE 3+ VIEW COMPARISON:  None. FINDINGS: Diffuse bone demineralization. Mild diffuse degenerative changes in the right hand and wrist. Small cortical fragment demonstrated in the dorsum of the right wrist probably representing a triquetrum avulsion fracture. Dorsal soft tissue swelling is present. No focal bone lesion or bone destruction. Vascular calcifications. Probable old fracture deformity of the distal radius. IMPRESSION: Bone fragment in the dorsum of the right wrist likely representing a triquetrum fracture. Soft tissue swelling. Electronically Signed   By: Burman Nieves M.D.   On: 11/11/2016 00:29    Procedures Procedures (including critical care time)  Medications Ordered in ED Medications  - No data to display   Initial Impression / Assessment and Plan / ED Course  I have reviewed the triage vital signs and the nursing notes.  Pertinent labs & imaging results that were available during my care of the patient were reviewed by me and considered in my medical decision making (see chart for details).   Triquetral fracture on xray.  Will place in volar splint.  Splint by ortho tech.  Follow up with   Final Clinical Impressions(s) / ED Diagnoses   Final diagnoses:  Closed chip fracture of triquetrum of right wrist, initial encounter    New Prescriptions Discharge Medication List as of 11/11/2016  1:46 AM       Linwood Dibbles, MD 11/11/16 [phone]

## 2016-11-11 NOTE — ED Notes (Signed)
Contacted [facility] for tx to [facility]

## 2016-11-11 NOTE — ED Notes (Signed)
Multiple attempts made to call report to [facility] with no answer. Charge RN also made multiple attempts to contact facility with no success. Pt to be discharged to facility by [facility]

## 2016-11-11 NOTE — ED Notes (Signed)
Ortho paged. 

## 2016-11-22 ENCOUNTER — Emergency Department (HOSPITAL_COMMUNITY): Payer: Medicare Other

## 2016-11-22 ENCOUNTER — Encounter (HOSPITAL_COMMUNITY): Payer: Self-pay | Admitting: *Deleted

## 2016-11-22 ENCOUNTER — Emergency Department (HOSPITAL_COMMUNITY)
Admission: EM | Admit: 2016-11-22 | Discharge: 2016-11-23 | Disposition: A | Discharge status: Home / Self Care | Payer: Medicare Other | Attending: Emergency Medicine | Admitting: Emergency Medicine

## 2016-11-22 DIAGNOSIS — I2510 Atherosclerotic heart disease of native coronary artery without angina pectoris: Secondary | ICD-10-CM | POA: Diagnosis not present

## 2016-11-22 DIAGNOSIS — Y9301 Activity, walking, marching and hiking: Secondary | ICD-10-CM | POA: Insufficient documentation

## 2016-11-22 DIAGNOSIS — Y929 Unspecified place or not applicable: Secondary | ICD-10-CM | POA: Diagnosis not present

## 2016-11-22 DIAGNOSIS — Y999 Unspecified external cause status: Secondary | ICD-10-CM | POA: Diagnosis not present

## 2016-11-22 DIAGNOSIS — S0181XA Laceration without foreign body of other part of head, initial encounter: Secondary | ICD-10-CM | POA: Diagnosis not present

## 2016-11-22 DIAGNOSIS — S2231XA Fracture of one rib, right side, initial encounter for closed fracture: Secondary | ICD-10-CM | POA: Diagnosis not present

## 2016-11-22 DIAGNOSIS — G9389 Other specified disorders of brain: Secondary | ICD-10-CM | POA: Diagnosis not present

## 2016-11-22 DIAGNOSIS — Z7902 Long term (current) use of antithrombotics/antiplatelets: Secondary | ICD-10-CM | POA: Diagnosis not present

## 2016-11-22 DIAGNOSIS — F17200 Nicotine dependence, unspecified, uncomplicated: Secondary | ICD-10-CM | POA: Insufficient documentation

## 2016-11-22 DIAGNOSIS — I119 Hypertensive heart disease without heart failure: Secondary | ICD-10-CM | POA: Insufficient documentation

## 2016-11-22 DIAGNOSIS — N390 Urinary tract infection, site not specified: Secondary | ICD-10-CM

## 2016-11-22 DIAGNOSIS — Z79899 Other long term (current) drug therapy: Secondary | ICD-10-CM | POA: Diagnosis not present

## 2016-11-22 DIAGNOSIS — W01198A Fall on same level from slipping, tripping and stumbling with subsequent striking against other object, initial encounter: Secondary | ICD-10-CM | POA: Insufficient documentation

## 2016-11-22 DIAGNOSIS — S299XXA Unspecified injury of thorax, initial encounter: Secondary | ICD-10-CM | POA: Diagnosis present

## 2016-11-22 LAB — URINALYSIS, ROUTINE W REFLEX MICROSCOPIC
Bilirubin Urine: NEGATIVE
Glucose, UA: NEGATIVE mg/dL
Hgb urine dipstick: NEGATIVE
KETONES UR: NEGATIVE mg/dL
NITRITE: NEGATIVE
PROTEIN: NEGATIVE mg/dL
Specific Gravity, Urine: 1.019 (ref 1.005–1.030)
pH: 5 (ref 5.0–8.0)

## 2016-11-22 LAB — CBC
HCT: 34.3 % — ABNORMAL LOW (ref 36.0–46.0)
HEMOGLOBIN: 10.7 g/dL — AB (ref 12.0–15.0)
MCH: 28.7 pg (ref 26.0–34.0)
MCHC: 31.2 g/dL (ref 30.0–36.0)
MCV: 92 fL (ref 78.0–100.0)
Platelets: 294 10*3/uL (ref 150–400)
RBC: 3.73 MIL/uL — ABNORMAL LOW (ref 3.87–5.11)
RDW: 15.9 % — AB (ref 11.5–15.5)
WBC: 8.2 10*3/uL (ref 4.0–10.5)

## 2016-11-22 LAB — COMPREHENSIVE METABOLIC PANEL
ALBUMIN: 3.3 g/dL — AB (ref 3.5–5.0)
ALK PHOS: 113 U/L (ref 38–126)
ALT: 11 U/L — ABNORMAL LOW (ref 14–54)
ANION GAP: 10 (ref 5–15)
AST: 15 U/L (ref 15–41)
BUN: 7 mg/dL (ref 6–20)
CO2: 24 mmol/L (ref 22–32)
Calcium: 9.1 mg/dL (ref 8.9–10.3)
Chloride: 104 mmol/L (ref 101–111)
Creatinine, Ser: 0.84 mg/dL (ref 0.44–1.00)
GFR calc Af Amer: >60 mL/min (ref >60)
GFR calc non Af Amer: >60 mL/min (ref >60)
GLUCOSE: 106 mg/dL — AB (ref 65–99)
POTASSIUM: 4.1 mmol/L (ref 3.5–5.1)
SODIUM: 138 mmol/L (ref 135–145)
Total Bilirubin: 0.4 mg/dL (ref 0.3–1.2)
Total Protein: 6 g/dL — ABNORMAL LOW (ref 6.5–8.1)

## 2016-11-22 LAB — LIPASE, BLOOD: Lipase: 21 U/L (ref 11–51)

## 2016-11-22 MED ORDER — LIDOCAINE-EPINEPHRINE (PF) 2 %-1:200000 IJ SOLN
10.0000 mL | Freq: Once | INTRAMUSCULAR | Status: AC
  Administered 2016-11-22: 10 mL via INTRADERMAL
  Filled 2016-11-22: qty 20

## 2016-11-22 MED ORDER — HYDROCODONE-ACETAMINOPHEN 5-325 MG PO TABS
2.0000 | ORAL_TABLET | Freq: Once | ORAL | Status: AC
  Administered 2016-11-22: 2 via ORAL
  Filled 2016-11-22: qty 2

## 2016-11-22 MED ORDER — MORPHINE SULFATE (PF) 4 MG/ML IV SOLN
4.0000 mg | Freq: Once | INTRAVENOUS | Status: AC
  Administered 2016-11-22: 4 mg via INTRAVENOUS
  Filled 2016-11-22: qty 1

## 2016-11-22 MED ORDER — IOPAMIDOL (ISOVUE-300) INJECTION 61%
75.0000 mL | Freq: Once | INTRAVENOUS | Status: AC | PRN
  Administered 2016-11-22: 75 mL via INTRAVENOUS

## 2016-11-22 MED ORDER — CEPHALEXIN 250 MG PO CAPS
500.0000 mg | ORAL_CAPSULE | Freq: Once | ORAL | Status: AC
  Administered 2016-11-22: 500 mg via ORAL
  Filled 2016-11-22: qty 2

## 2016-11-22 MED ORDER — LIDOCAINE-EPINEPHRINE-TETRACAINE (LET) SOLUTION
3.0000 mL | Freq: Once | NASAL | Status: AC
  Administered 2016-11-22: 3 mL via TOPICAL
  Filled 2016-11-22: qty 3

## 2016-11-22 NOTE — ED Triage Notes (Signed)
Pt arrived via EMS with c/c of pain to ribs/thorax and chin after falling at her home this morning. Pt denied LOC or dizziness before fall. Pt states she tripped over her walker and fell to floor. 1-inch laceration noted to her chin with minor bleeding. Pt is alert and oriented x4. Pt denies head or neck pain.

## 2016-11-22 NOTE — ED Notes (Signed)
Patient transported to CT 

## 2016-11-22 NOTE — ED Notes (Signed)
Patient transported to X-ray 

## 2016-11-22 NOTE — ED Provider Notes (Signed)
[facility]-EMERGENCY DEPT Provider Note   CSN: [id number] Arrival date & time: 11/22/16  1936     History   Chief Complaint Chief Complaint  Patient presents with  . Fall    HPI Sandra Rice is a 60 y.o. female.  HPI 61 yo female with history of hypertension, myocardial infarction, recurrent UTIs, who presents with fall. The patient states she was using her walker today when she slipped and tripped on her walker. She fell forward landing on her chest and chin. She reports immediate onset of sharp, positional, severe chest pain that is worse with any movement or deep breathing. She also sustained a laceration to her chin. She has mild chin pain but denies any tooth pain. Denies any oral bleeding. No loss of consciousness. Denies any neck pain. She is not on any blood thinners. Denies any alleviating factors other than trying to stay still.  Past Medical History:  Diagnosis Date  . Coronary artery disease   . GERD (gastroesophageal reflux disease)   . Hypertension   . Myocardial infarction (HCC)   . Renal artery stenosis (HCC)   . Stroke [facility])     There are no active problems to display for this patient.   No past surgical history on file.  OB History    No data available       Home Medications    Prior to Admission medications   Medication Sig Start Date End Date Taking? Authorizing Provider  acetaminophen (TYLENOL) 325 MG tablet Take 650 mg by mouth every 8 (eight) hours as needed. Pain/fever     [provider]  aluminum & magnesium hydroxide-simethicone (MYLANTA) 500-450-40 MG/5ML suspension Take by mouth every 6 (six) hours as needed.      [provider]  aluminum-magnesium hydroxide-simethicone (MAALOX) 200-200-20 MG/5ML SUSP Take 10-20 mLs by mouth 4 (four) times daily -  before meals and at bedtime. indigestion     [provider]  carvedilol (COREG) 3.125 MG tablet Take 3.125 mg by mouth 2 (two) times daily with a meal.      [provider]  cephALEXin (KEFLEX) 500 MG capsule Take 1 capsule (500 mg total) by mouth 3 (three) times daily. 11/23/16 11/30/16  Shaune Pollack, MD  clopidogrel (PLAVIX) 75 MG tablet Take 75 mg by mouth daily.      [provider]  HYDROcodone-acetaminophen (NORCO) 5-325 MG per tablet Take 1 tablet by mouth every 4 (four) hours as needed. pain    [provider]  lisinopril (PRINIVIL,ZESTRIL) 20 MG tablet Take 20 mg by mouth daily.      [provider]  Multiple Vitamins-Minerals (MULTIVITAMIN WITH MINERALS) tablet Take 1 tablet by mouth daily.      [provider]  oxyCODONE-acetaminophen (PERCOCET/ROXICET) 5-325 MG tablet Take 1-2 tablets by mouth every 6 (six) hours as needed for moderate pain or severe pain. 11/23/16   Shaune Pollack, MD  pantoprazole (PROTONIX) 40 MG tablet Take 40 mg by mouth daily.      [provider]  potassium chloride (KLOR-CON) 10 MEQ CR tablet Take 10 mEq by mouth daily.      [provider]  pravastatin (PRAVACHOL) 40 MG tablet Take 40 mg by mouth daily.      [provider]  sertraline (ZOLOFT) 50 MG tablet Take 50 mg by mouth daily.      [provider]    Family History No family history on file.  Social History Social History  Substance Use Topics  .  Smoking status: Current Every Day Smoker    Packs/day: 0.50  . Smokeless tobacco: Not on file  . Alcohol use No     Allergies   Patient has no known allergies.   Review of Systems Review of Systems  Constitutional: Negative for fever.  Genitourinary: Positive for dysuria.  Musculoskeletal: Positive for arthralgias.  Skin: Positive for wound.  All other systems reviewed and are negative.    Physical Exam Updated Vital Signs BP 110/71   Pulse 62   Temp 98.3 F (36.8 C) (Oral)   Resp 16   SpO2 97%   Physical Exam  Constitutional: She is oriented to person, place, and time. She appears well-developed and well-nourished.  No distress.  HENT:  Head: Normocephalic.  Approximately 2 cm curvilinear laceration at the bottom of the chin. No involvement of underlying muscle. No oral lesions or bleeding. No other significant trauma. There is mild surrounding ecchymosis.  Eyes: Conjunctivae are normal.  Neck: Neck supple.  Cardiovascular: Normal rate, regular rhythm and normal heart sounds.  Exam reveals no friction rub.   No murmur heard. Pulmonary/Chest: Effort normal and breath sounds normal. No respiratory distress. She has no wheezes. She has no rales.  Abdominal: She exhibits no distension.  Musculoskeletal: She exhibits no edema.  Neurological: She is alert and oriented to person, place, and time. She exhibits normal muscle tone.  Skin: Skin is warm. Capillary refill takes less than 2 seconds.  Psychiatric: She has a normal mood and affect.  Nursing note and vitals reviewed.    ED Treatments / Results  Labs (all labs ordered are listed, but only abnormal results are displayed) Labs Reviewed  URINALYSIS, ROUTINE W REFLEX MICROSCOPIC - Abnormal; Notable for the following:       Result Value   APPearance HAZY (*)    Leukocytes, UA MODERATE (*)    Bacteria, UA FEW (*)    Squamous Epithelial / LPF 0-5 (*)    All other components within normal limits  CBC - Abnormal; Notable for the following:    RBC 3.73 (*)    Hemoglobin 10.7 (*)    HCT 34.3 (*)    RDW 15.9 (*)    All other components within normal limits  COMPREHENSIVE METABOLIC PANEL - Abnormal; Notable for the following:    Glucose, Bld 106 (*)    Total Protein 6.0 (*)    Albumin 3.3 (*)    ALT 11 (*)    All other components within normal limits  URINE CULTURE  LIPASE, BLOOD  I-STAT CHEM 8, ED    EKG  EKG Interpretation  Date/Time:  Thursday November 22 2016 20:11:57 EDT Ventricular Rate:  61 PR Interval:  132 QRS Duration: 82 QT Interval:  462 QTC Calculation: 465 R Axis:   25 Text Interpretation:  Normal sinus rhythm RSR' or QR  pattern in V1 suggests right ventricular conduction delay Borderline ECG No significant change since last tracing Confirmed by Shaune Pollack [phone]) on 11/22/2016 10:58:27 PM       Radiology Dg Chest 2 View  Result Date: 11/22/2016 CLINICAL DATA:  Patient with chest pain status post fall. No reported loss of consciousness. Initial encounter. EXAM: CHEST  2 VIEW COMPARISON:  Chest radiograph 08/13/2016 FINDINGS: Patient is rotated. Stable cardiac and mediastinal contours. Heterogeneous opacities right lung base. No pleural effusion or pneumothorax. Old right clavicle fracture. No definite evidence for displaced rib fracture on limited views. IMPRESSION: Mild heterogeneous opacities right lung base may represent atelectasis. Aortic atherosclerosis.  Electronically Signed   By: Annia Belt M.D.   On: 11/22/2016 21:02   Ct Head Wo Contrast  Result Date: 11/22/2016 CLINICAL DATA:  Patient tripped over walker and fell. Small cuts chin. EXAM: CT HEAD WITHOUT CONTRAST CT CERVICAL SPINE WITHOUT CONTRAST TECHNIQUE: Multidetector CT imaging of the head and cervical spine was performed following the standard protocol without intravenous contrast. Multiplanar CT image reconstructions of the cervical spine were also generated. COMPARISON:  09/06/2012 MRI the brain, 09/05/2012 CT FINDINGS: CT HEAD FINDINGS Brain: Chronic stable right frontal lobe encephalomalacia from chronic right PCA infarct. Chronic small right cerebellar infarct with encephalomalacia. Moderate degree of small vessel ischemic disease of periventricular white matter with superficial and central atrophy. No acute intracranial hemorrhage, midline shift or edema. Chronic lacunar infarcts of the left caudate and right thalamus as well as bilateral basal ganglia and right pons. No intra-axial mass nor extra-axial fluid collections. Vascular: Moderate left vertebral and bilateral carotid siphon calcifications. No hyperdense vessels. Skull: Negative for  fracture or focal osseous lesions. Sinuses/Orbits: Minimal mucosal thickening of the ethmoid, sphenoid and right maxillary sinuses. Clear mastoids. Intact orbits and globes. Other: None CT CERVICAL SPINE FINDINGS Alignment: Intact craniocervical relationship and atlantodental interval. Minimal retrolisthesis of C3 on C4 and C4 on C5 by 1.4 mm each. This is likely on basis of degenerative disc disease. Skull base and vertebrae: No acute fracture. No primary bone lesion or focal pathologic process. Soft tissues and spinal canal: No prevertebral fluid or swelling. No visible canal hematoma. Disc levels: Cervical spondylitic disc space narrowing from C3 through C6 with small posterior marginal osteophytes contributing to bilateral neural foraminal narrowing. No jumped facets. No central canal stenosis. Uncovertebral joint osteoarthritis with spurring is identified on the right at C3-4 and bilaterally at C4-5 and C5-6. Upper chest: Negative. Other: Extracranial carotid arteriosclerosis is noted bilaterally. No thyroid masses. Atherosclerosis of the great vessels. IMPRESSION: 1. No acute intracranial abnormality. 2. Chronic right frontal and cerebellar infarcts with bilateral basal ganglial, right pontine, right thalamic and left caudate lacunar infarcts. 3. Cervical spondylosis without acute posttraumatic fracture or subluxation. Electronically Signed   By: Tollie Eth M.D.   On: 11/22/2016 21:08   Ct Chest W Contrast  Result Date: 11/22/2016 CLINICAL DATA:  Fall, chest trauma.  Diffuse chest pain. EXAM: CT CHEST WITH CONTRAST TECHNIQUE: Multidetector CT imaging of the chest was performed during intravenous contrast administration. CONTRAST:  82mL ISOVUE-300 IOPAMIDOL (ISOVUE-300) INJECTION 61% COMPARISON:  Radiographs earlier this day.  Chest CT 11/17/2010 FINDINGS: Cardiovascular: No acute aortic injury. Atherosclerosis of the thoracic aorta with mild tortuosity. No aneurysm. Coronary artery calcifications versus  stents. The heart is normal in size. No pericardial fluid. Mediastinum/Nodes: No mediastinal hematoma. No mediastinal or hilar adenopathy. The esophagus is decompressed. No pneumomediastinum. Prominent epicardial fat pads bilaterally. Lungs/Pleura: No pneumothorax. Dependent and linear atelectasis in both lung bases. Linear atelectasis in the paramediastinal right middle and right upper lobes. No evidence pulmonary contusion. No pleural fluid. Calcified granuloma in the right lower lobe. Upper Abdomen: No acute or traumatic abnormality. Atrophy of the upper right kidney with cysts, partially included, stable from prior exam. There is atherosclerosis of intra-abdominal vasculature. Musculoskeletal: Cortical buckling of right anterior third rib at the costochondral junction, suspicious for nondisplaced fracture. No additional acute fracture. Remote fracture of the right clavicle is only partially included on CT. The sternum is intact. No fracture or subluxation of the thoracic spine. No body wall contusion. IMPRESSION: 1. Probable nondisplaced right anterior third  rib fracture, possibly acute. No pulmonary complication. 2. Multifocal atelectasis. 3. Diffuse aortic atherosclerosis. Coronary artery calcifications versus stents. Electronically Signed   By: Rubye Oaks M.D.   On: 11/22/2016 23:54   Ct Cervical Spine Wo Contrast  Result Date: 11/22/2016 CLINICAL DATA:  Patient tripped over walker and fell. Small cuts chin. EXAM: CT HEAD WITHOUT CONTRAST CT CERVICAL SPINE WITHOUT CONTRAST TECHNIQUE: Multidetector CT imaging of the head and cervical spine was performed following the standard protocol without intravenous contrast. Multiplanar CT image reconstructions of the cervical spine were also generated. COMPARISON:  09/06/2012 MRI the brain, 09/05/2012 CT FINDINGS: CT HEAD FINDINGS Brain: Chronic stable right frontal lobe encephalomalacia from chronic right PCA infarct. Chronic small right cerebellar infarct  with encephalomalacia. Moderate degree of small vessel ischemic disease of periventricular white matter with superficial and central atrophy. No acute intracranial hemorrhage, midline shift or edema. Chronic lacunar infarcts of the left caudate and right thalamus as well as bilateral basal ganglia and right pons. No intra-axial mass nor extra-axial fluid collections. Vascular: Moderate left vertebral and bilateral carotid siphon calcifications. No hyperdense vessels. Skull: Negative for fracture or focal osseous lesions. Sinuses/Orbits: Minimal mucosal thickening of the ethmoid, sphenoid and right maxillary sinuses. Clear mastoids. Intact orbits and globes. Other: None CT CERVICAL SPINE FINDINGS Alignment: Intact craniocervical relationship and atlantodental interval. Minimal retrolisthesis of C3 on C4 and C4 on C5 by 1.4 mm each. This is likely on basis of degenerative disc disease. Skull base and vertebrae: No acute fracture. No primary bone lesion or focal pathologic process. Soft tissues and spinal canal: No prevertebral fluid or swelling. No visible canal hematoma. Disc levels: Cervical spondylitic disc space narrowing from C3 through C6 with small posterior marginal osteophytes contributing to bilateral neural foraminal narrowing. No jumped facets. No central canal stenosis. Uncovertebral joint osteoarthritis with spurring is identified on the right at C3-4 and bilaterally at C4-5 and C5-6. Upper chest: Negative. Other: Extracranial carotid arteriosclerosis is noted bilaterally. No thyroid masses. Atherosclerosis of the great vessels. IMPRESSION: 1. No acute intracranial abnormality. 2. Chronic right frontal and cerebellar infarcts with bilateral basal ganglial, right pontine, right thalamic and left caudate lacunar infarcts. 3. Cervical spondylosis without acute posttraumatic fracture or subluxation. Electronically Signed   By: Tollie Eth M.D.   On: 11/22/2016 21:08    Procedures .Marland KitchenLaceration  Repair Date/Time: 11/23/2016 11:03 AM Performed by: Shaune Pollack Authorized by: Shaune Pollack   Consent:    Consent obtained:  Verbal   Consent given by:  Patient   Risks discussed:  Need for additional repair, poor cosmetic result, poor wound healing, infection and pain   Alternatives discussed:  Referral Anesthesia (see MAR for exact dosages):    Anesthesia method:  Local infiltration   Local anesthetic:  Lidocaine 1% WITH epi Laceration details:    Location:  Face   Face location:  Chin   Length (cm):  3 Repair type:    Repair type:  Simple Pre-procedure details:    Preparation:  Patient was prepped and draped in usual sterile fashion Exploration:    Hemostasis achieved with:  Direct pressure   Wound exploration: wound explored through full range of motion     Wound extent: no fascia violation noted and no muscle damage noted     Contaminated: no   Treatment:    Area cleansed with:  Betadine   Amount of cleaning:  Extensive   Irrigation solution:  Sterile saline   Irrigation method:  Pressure wash Skin repair:  Repair method:  Sutures   Suture size:  5-0   Suture material:  Prolene   Suture technique:  Simple interrupted   Number of sutures:  3 Approximation:    Approximation:  Close   Vermilion border: well-aligned     (including critical care time)  Medications Ordered in ED Medications  lidocaine-EPINEPHrine-tetracaine (LET) solution (3 mLs Topical Given 11/22/16 2103)  HYDROcodone-acetaminophen (NORCO/VICODIN) 5-325 MG per tablet 2 tablet (2 tablets Oral Given 11/22/16 2117)  cephALEXin (KEFLEX) capsule 500 mg (500 mg Oral Given 11/22/16 2117)  lidocaine-EPINEPHrine (XYLOCAINE W/EPI) 2 %-1:200000 (PF) injection 10 mL (10 mLs Intradermal Given 11/22/16 2138)  morphine 4 MG/ML injection 4 mg (4 mg Intravenous Given 11/22/16 2324)  iopamidol (ISOVUE-300) 61 % injection 75 mL (75 mLs Intravenous Contrast Given 11/22/16 2313)     Initial Impression / Assessment and  Plan / ED Course  I have reviewed the triage vital signs and the nursing notes.  Pertinent labs & imaging results that were available during my care of the patient were reviewed by me and considered in my medical decision making (see chart for details).     61 year old female with past medical history as above who presents with chin laceration and chest wall pain after mechanical fall. Of note, patient also has dysuria and does have a UTI for which she will be placed on Keflex. No fever, flank pain, or evidence of pyelonephritis or sepsis. CT head and C-spine is negative. Chest x-ray did show mild atelectasis at CT chest obtained to evaluate for underlying rib fractures. Otherwise, will give analgesia, plan to ambulate, and dispose accordingly.  CT shows single rib fx. Labs o/w reassuring. Pt feels better with pain controlled and is at her medical baseline. Will d/c with incentive spirometer, analgesia, keflex, and outpt follow-up.   Final Clinical Impressions(s) / ED Diagnoses   Final diagnoses:  Closed fracture of one rib of right side, initial encounter  Chin laceration, initial encounter  Lower urinary tract infectious disease    New Prescriptions Discharge Medication List as of 11/23/2016 12:10 AM    START taking these medications   Details  cephALEXin (KEFLEX) 500 MG capsule Take 1 capsule (500 mg total) by mouth 3 (three) times daily., Starting Fri 11/23/2016, Until Fri 11/30/2016, Print    oxyCODONE-acetaminophen (PERCOCET/ROXICET) 5-325 MG tablet Take 1-2 tablets by mouth every 6 (six) hours as needed for moderate pain or severe pain., Starting Fri 11/23/2016, Print         Shaune Pollack, MD 11/23/16 [phone]

## 2016-11-23 DIAGNOSIS — S2231XA Fracture of one rib, right side, initial encounter for closed fracture: Secondary | ICD-10-CM | POA: Diagnosis not present

## 2016-11-23 MED ORDER — OXYCODONE-ACETAMINOPHEN 5-325 MG PO TABS: 1.0000 | ORAL_TABLET | Freq: Four times a day (QID) | ORAL | 0 refills | Status: DC | PRN

## 2016-11-23 MED ORDER — CEPHALEXIN 500 MG PO CAPS: 500.0000 mg | ORAL_CAPSULE | Freq: Three times a day (TID) | ORAL | 0 refills | Status: AC

## 2016-11-23 NOTE — Discharge Instructions (Signed)
Follow-up with your primary doctor or the emergency department in 5 days for suture removal. Apply antibiotic ointment to the wound twice a day until sutures are removed.  Your CT scan showed a single rib fracture on the right. We have given new pain medications for this. It is important that you take your medications as needed. You should use your Spirometer at least 6-8 times daily and make sure to walk, sit upright, and take deep breaths.

## 2016-11-24 LAB — URINE CULTURE: SPECIAL REQUESTS: NORMAL

## 2016-12-11 ENCOUNTER — Emergency Department (HOSPITAL_COMMUNITY): Payer: Medicare Other

## 2016-12-11 ENCOUNTER — Observation Stay (HOSPITAL_COMMUNITY)
Admission: EM | Admit: 2016-12-11 | Discharge: 2016-12-12 | Disposition: A | Discharge status: Skilled Nursing Facility | Payer: Medicare Other | Attending: Internal Medicine | Admitting: Internal Medicine

## 2016-12-11 ENCOUNTER — Encounter (HOSPITAL_COMMUNITY): Payer: Self-pay | Admitting: Emergency Medicine

## 2016-12-11 DIAGNOSIS — I69319 Unspecified symptoms and signs involving cognitive functions following cerebral infarction: Secondary | ICD-10-CM | POA: Diagnosis not present

## 2016-12-11 DIAGNOSIS — J9811 Atelectasis: Secondary | ICD-10-CM | POA: Insufficient documentation

## 2016-12-11 DIAGNOSIS — R9431 Abnormal electrocardiogram [ECG] [EKG]: Secondary | ICD-10-CM | POA: Diagnosis not present

## 2016-12-11 DIAGNOSIS — Z79899 Other long term (current) drug therapy: Secondary | ICD-10-CM | POA: Insufficient documentation

## 2016-12-11 DIAGNOSIS — J181 Lobar pneumonia, unspecified organism: Secondary | ICD-10-CM

## 2016-12-11 DIAGNOSIS — Z8673 Personal history of transient ischemic attack (TIA), and cerebral infarction without residual deficits: Secondary | ICD-10-CM

## 2016-12-11 DIAGNOSIS — J449 Chronic obstructive pulmonary disease, unspecified: Secondary | ICD-10-CM

## 2016-12-11 DIAGNOSIS — R05 Cough: Secondary | ICD-10-CM

## 2016-12-11 DIAGNOSIS — Z7902 Long term (current) use of antithrombotics/antiplatelets: Secondary | ICD-10-CM | POA: Insufficient documentation

## 2016-12-11 DIAGNOSIS — W19XXXA Unspecified fall, initial encounter: Secondary | ICD-10-CM | POA: Diagnosis present

## 2016-12-11 DIAGNOSIS — M25561 Pain in right knee: Secondary | ICD-10-CM | POA: Insufficient documentation

## 2016-12-11 DIAGNOSIS — Z79891 Long term (current) use of opiate analgesic: Secondary | ICD-10-CM | POA: Insufficient documentation

## 2016-12-11 DIAGNOSIS — F17210 Nicotine dependence, cigarettes, uncomplicated: Secondary | ICD-10-CM | POA: Insufficient documentation

## 2016-12-11 DIAGNOSIS — J90 Pleural effusion, not elsewhere classified: Secondary | ICD-10-CM

## 2016-12-11 DIAGNOSIS — I252 Old myocardial infarction: Secondary | ICD-10-CM | POA: Insufficient documentation

## 2016-12-11 DIAGNOSIS — I10 Essential (primary) hypertension: Secondary | ICD-10-CM | POA: Diagnosis present

## 2016-12-11 DIAGNOSIS — R4182 Altered mental status, unspecified: Secondary | ICD-10-CM | POA: Diagnosis not present

## 2016-12-11 DIAGNOSIS — K219 Gastro-esophageal reflux disease without esophagitis: Secondary | ICD-10-CM | POA: Insufficient documentation

## 2016-12-11 DIAGNOSIS — Z7951 Long term (current) use of inhaled steroids: Secondary | ICD-10-CM | POA: Insufficient documentation

## 2016-12-11 DIAGNOSIS — I2510 Atherosclerotic heart disease of native coronary artery without angina pectoris: Secondary | ICD-10-CM | POA: Insufficient documentation

## 2016-12-11 DIAGNOSIS — I700 Atherosclerosis of aorta: Secondary | ICD-10-CM

## 2016-12-11 DIAGNOSIS — Y92193 Bedroom in other specified residential institution as the place of occurrence of the external cause: Secondary | ICD-10-CM | POA: Insufficient documentation

## 2016-12-11 DIAGNOSIS — Z7982 Long term (current) use of aspirin: Secondary | ICD-10-CM | POA: Insufficient documentation

## 2016-12-11 DIAGNOSIS — J189 Pneumonia, unspecified organism: Secondary | ICD-10-CM

## 2016-12-11 DIAGNOSIS — W06XXXA Fall from bed, initial encounter: Secondary | ICD-10-CM | POA: Insufficient documentation

## 2016-12-11 DIAGNOSIS — R059 Cough, unspecified: Secondary | ICD-10-CM

## 2016-12-11 LAB — CBC WITH DIFFERENTIAL/PLATELET
BASOS ABS: 0 10*3/uL (ref 0.0–0.1)
BASOS PCT: 0 %
EOS ABS: 0 10*3/uL (ref 0.0–0.7)
Eosinophils Relative: 0 %
HCT: 38.7 % (ref 36.0–46.0)
Hemoglobin: 12 g/dL (ref 12.0–15.0)
Lymphocytes Relative: 10 %
Lymphs Abs: 1.5 10*3/uL (ref 0.7–4.0)
MCH: 28.9 pg (ref 26.0–34.0)
MCHC: 31 g/dL (ref 30.0–36.0)
MCV: 93.3 fL (ref 78.0–100.0)
MONO ABS: 0.8 10*3/uL (ref 0.1–1.0)
Monocytes Relative: 5 %
NEUTROS ABS: 13.6 10*3/uL — AB (ref 1.7–7.7)
Neutrophils Relative %: 85 %
PLATELETS: 253 10*3/uL (ref 150–400)
RBC: 4.15 MIL/uL (ref 3.87–5.11)
RDW: 15.6 % — AB (ref 11.5–15.5)
WBC: 15.9 10*3/uL — ABNORMAL HIGH (ref 4.0–10.5)

## 2016-12-11 LAB — COMPREHENSIVE METABOLIC PANEL
ALBUMIN: 3.2 g/dL — AB (ref 3.5–5.0)
ALT: 9 U/L — ABNORMAL LOW (ref 14–54)
AST: 28 U/L (ref 15–41)
Alkaline Phosphatase: 130 U/L — ABNORMAL HIGH (ref 38–126)
Anion gap: 11 (ref 5–15)
BUN: 10 mg/dL (ref 6–20)
CHLORIDE: 104 mmol/L (ref 101–111)
CO2: 21 mmol/L — AB (ref 22–32)
Calcium: 8.8 mg/dL — ABNORMAL LOW (ref 8.9–10.3)
Creatinine, Ser: 0.89 mg/dL (ref 0.44–1.00)
GFR calc Af Amer: >60 mL/min (ref >60)
GFR calc non Af Amer: >60 mL/min (ref >60)
GLUCOSE: 145 mg/dL — AB (ref 65–99)
POTASSIUM: 4.9 mmol/L (ref 3.5–5.1)
SODIUM: 136 mmol/L (ref 135–145)
Total Bilirubin: 1.4 mg/dL — ABNORMAL HIGH (ref 0.3–1.2)
Total Protein: 6.1 g/dL — ABNORMAL LOW (ref 6.5–8.1)

## 2016-12-11 LAB — URINALYSIS, ROUTINE W REFLEX MICROSCOPIC
Bilirubin Urine: NEGATIVE
Glucose, UA: NEGATIVE mg/dL
Hgb urine dipstick: NEGATIVE
Ketones, ur: NEGATIVE mg/dL
Leukocytes, UA: NEGATIVE
Nitrite: NEGATIVE
Protein, ur: NEGATIVE mg/dL
Specific Gravity, Urine: 1.021 (ref 1.005–1.030)
pH: 5 (ref 5.0–8.0)

## 2016-12-11 LAB — I-STAT TROPONIN, ED: Troponin i, poc: 0.04 ng/mL (ref 0.00–0.08)

## 2016-12-11 LAB — MRSA PCR SCREENING: MRSA by PCR: POSITIVE — AB

## 2016-12-11 LAB — CK: CK TOTAL: 83 U/L (ref 38–234)

## 2016-12-11 LAB — I-STAT CG4 LACTIC ACID, ED: LACTIC ACID, VENOUS: 1.26 mmol/L (ref 0.5–1.9)

## 2016-12-11 MED ORDER — PHENYTOIN 50 MG PO CHEW
200.0000 mg | CHEWABLE_TABLET | Freq: Every evening | ORAL | Status: DC
  Administered 2016-12-11 – 2016-12-12 (×2): 200 mg via ORAL
  Filled 2016-12-11 (×2): qty 4

## 2016-12-11 MED ORDER — SODIUM CHLORIDE 0.9 % IV BOLUS (SEPSIS)
1000.0000 mL | Freq: Once | INTRAVENOUS | Status: AC
  Administered 2016-12-11: 1000 mL via INTRAVENOUS

## 2016-12-11 MED ORDER — ALBUTEROL SULFATE (2.5 MG/3ML) 0.083% IN NEBU: 2.5000 mg | INHALATION_SOLUTION | Freq: Four times a day (QID) | RESPIRATORY_TRACT | Status: DC | PRN

## 2016-12-11 MED ORDER — PHENYTOIN 50 MG PO CHEW
100.0000 mg | CHEWABLE_TABLET | Freq: Every morning | ORAL | Status: DC
  Administered 2016-12-12: 100 mg via ORAL
  Filled 2016-12-11: qty 2

## 2016-12-11 MED ORDER — ENOXAPARIN SODIUM 40 MG/0.4ML ~~LOC~~ SOLN
40.0000 mg | SUBCUTANEOUS | Status: DC
  Administered 2016-12-11: 40 mg via SUBCUTANEOUS
  Filled 2016-12-11 (×2): qty 0.4

## 2016-12-11 MED ORDER — ATORVASTATIN CALCIUM 80 MG PO TABS
80.0000 mg | ORAL_TABLET | Freq: Every day | ORAL | Status: DC
  Administered 2016-12-11 – 2016-12-12 (×2): 80 mg via ORAL
  Filled 2016-12-11 (×2): qty 1

## 2016-12-11 MED ORDER — MOMETASONE FURO-FORMOTEROL FUM 100-5 MCG/ACT IN AERO
2.0000 | INHALATION_SPRAY | Freq: Two times a day (BID) | RESPIRATORY_TRACT | Status: DC
  Administered 2016-12-11 – 2016-12-12 (×2): 2 via RESPIRATORY_TRACT
  Filled 2016-12-11: qty 8.8

## 2016-12-11 MED ORDER — CHLORHEXIDINE GLUCONATE CLOTH 2 % EX PADS
6.0000 | MEDICATED_PAD | Freq: Every day | CUTANEOUS | Status: DC
  Administered 2016-12-12: 6 via TOPICAL

## 2016-12-11 MED ORDER — CITALOPRAM HYDROBROMIDE 10 MG PO TABS
10.0000 mg | ORAL_TABLET | Freq: Every day | ORAL | Status: DC
  Administered 2016-12-11 – 2016-12-12 (×2): 10 mg via ORAL
  Filled 2016-12-11 (×2): qty 1

## 2016-12-11 MED ORDER — ACETAMINOPHEN 325 MG PO TABS: 650.0000 mg | ORAL_TABLET | Freq: Four times a day (QID) | ORAL | Status: DC | PRN

## 2016-12-11 MED ORDER — METOPROLOL TARTRATE 25 MG PO TABS
100.0000 mg | ORAL_TABLET | Freq: Two times a day (BID) | ORAL | Status: DC
  Administered 2016-12-11 – 2016-12-12 (×2): 100 mg via ORAL
  Filled 2016-12-11 (×2): qty 4

## 2016-12-11 MED ORDER — ASPIRIN EC 81 MG PO TBEC
81.0000 mg | DELAYED_RELEASE_TABLET | Freq: Every day | ORAL | Status: DC
  Administered 2016-12-11 – 2016-12-12 (×2): 81 mg via ORAL
  Filled 2016-12-11 (×2): qty 1

## 2016-12-11 MED ORDER — MUPIROCIN 2 % EX OINT
1.0000 "application " | TOPICAL_OINTMENT | Freq: Two times a day (BID) | CUTANEOUS | Status: DC
  Administered 2016-12-11 – 2016-12-12 (×2): 1 via NASAL
  Filled 2016-12-11 (×2): qty 22

## 2016-12-11 MED ORDER — TICAGRELOR 90 MG PO TABS
90.0000 mg | ORAL_TABLET | Freq: Two times a day (BID) | ORAL | Status: DC
  Administered 2016-12-11 – 2016-12-12 (×2): 90 mg via ORAL
  Filled 2016-12-11 (×3): qty 1

## 2016-12-11 MED ORDER — PREDNISONE 2.5 MG PO TABS
2.5000 mg | ORAL_TABLET | Freq: Every day | ORAL | Status: DC
  Administered 2016-12-12: 2.5 mg via ORAL
  Filled 2016-12-11: qty 1

## 2016-12-11 MED ORDER — NICOTINE 14 MG/24HR TD PT24
14.0000 mg | MEDICATED_PATCH | Freq: Every day | TRANSDERMAL | Status: DC
  Administered 2016-12-11 – 2016-12-12 (×2): 14 mg via TRANSDERMAL
  Filled 2016-12-11 (×2): qty 1

## 2016-12-11 MED ORDER — LEVOFLOXACIN IN D5W 500 MG/100ML IV SOLN
500.0000 mg | Freq: Once | INTRAVENOUS | Status: AC
  Administered 2016-12-11: 500 mg via INTRAVENOUS
  Filled 2016-12-11: qty 100

## 2016-12-11 MED ORDER — SENNOSIDES-DOCUSATE SODIUM 8.6-50 MG PO TABS
1.0000 | ORAL_TABLET | Freq: Every evening | ORAL | Status: DC | PRN
Start: 2016-12-11 — End: 2016-12-12

## 2016-12-11 NOTE — Progress Notes (Signed)
Sandra Rice is a 61 y.o. female patient admitted from ED awake, alert - oriented  X 4 - no acute distress noted.  VSS - Blood pressure 129/64, pulse 93, temperature 98.1 F (36.7 C), resp. rate 18, height 5\' 2"  (1.575 m), weight 71.3 kg (157 lb 1.6 oz), SpO2 94 %.    IV in place, occlusive dsg intact without redness.  Orientation to room, and floor completed with information packet given to patient/family.  Patient declined safety video at this time.  Admission INP armband ID verified with patient/family, and in place.   SR up x 2, fall assessment complete, with patient and family able to verbalize understanding of risk associated with falls, and verbalized understanding to call nsg before up out of bed.  Call light within reach, patient able to voice, and demonstrate understanding.  Skin, clean-dry- with generalized bruseing and abrasions from previous falls. Skin break down noted on exam on right hip and under breast.     MRSA PCR positive per micro lab notifying dr blum. Placing contact precautions   Will cont to eval and treat per MD orders.  Casper Harrison Lonnel Gjerde, RN 12/11/2016 5:08 PM

## 2016-12-11 NOTE — ED Notes (Signed)
MDs at bedside

## 2016-12-11 NOTE — ED Provider Notes (Signed)
[facility]-EMERGENCY DEPT Provider Note   CSN: [id number] Arrival date & time: 12/11/16  0809     History   Chief Complaint Chief Complaint  Patient presents with  . Fall    HPI Sandra Rice is a 61 y.o. female.  HPI  Sandra Rice is a 61 y.o. female with history of coronary disease, MI, renal artery stenosis, CVA, presents to emergency department after a fall. Patient is coming from assisted living facility. She was last seen yesterday, was found on the floor this morning next to her bed. Patient states she fell. Patient denies any pain other than her right knee. Pt very somnolent, poor historian.   Past Medical History:  Diagnosis Date  . Coronary artery disease   . GERD (gastroesophageal reflux disease)   . Hypertension   . Myocardial infarction (HCC)   . Renal artery stenosis (HCC)   . Stroke [facility])     There are no active problems to display for this patient.   No past surgical history on file.  OB History    No data available       Home Medications    Prior to Admission medications   Medication Sig Start Date End Date Taking? Authorizing Provider  acetaminophen (TYLENOL) 325 MG tablet Take 650 mg by mouth every 8 (eight) hours as needed. Pain/fever     [provider]  aluminum & magnesium hydroxide-simethicone (MYLANTA) 500-450-40 MG/5ML suspension Take by mouth every 6 (six) hours as needed.      [provider]  aluminum-magnesium hydroxide-simethicone (MAALOX) 200-200-20 MG/5ML SUSP Take 10-20 mLs by mouth 4 (four) times daily -  before meals and at bedtime. indigestion     [provider]  carvedilol (COREG) 3.125 MG tablet Take 3.125 mg by mouth 2 (two) times daily with a meal.      [provider]  clopidogrel (PLAVIX) 75 MG tablet Take 75 mg by mouth daily.      [provider]  HYDROcodone-acetaminophen (NORCO) 5-325 MG per tablet Take 1 tablet by mouth every 4 (four) hours as needed. pain    [provider]  lisinopril (PRINIVIL,ZESTRIL) 20 MG tablet Take 20 mg by mouth daily.      [provider]  Multiple Vitamins-Minerals (MULTIVITAMIN WITH MINERALS) tablet Take 1 tablet by mouth daily.      [provider]  oxyCODONE-acetaminophen (PERCOCET/ROXICET) 5-325 MG tablet Take 1-2 tablets by mouth every 6 (six) hours as needed for moderate pain or severe pain. 11/23/16   Shaune Pollack, MD  pantoprazole (PROTONIX) 40 MG tablet Take 40 mg by mouth daily.      [provider]  potassium chloride (KLOR-CON) 10 MEQ CR tablet Take 10 mEq by mouth daily.      [provider]  pravastatin (PRAVACHOL) 40 MG tablet Take 40 mg by mouth daily.      [provider]  sertraline (ZOLOFT) 50 MG tablet Take 50 mg by mouth daily.      [provider]    Family History No family history on file.  Social History Social History  Substance Use Topics  . Smoking status: Current Every Day Smoker    Packs/day: 0.50  . Smokeless tobacco: Not on file  . Alcohol use No     Allergies   Patient has no known allergies.   Review of Systems Review of Systems  Constitutional: Negative for chills and fever.  Respiratory: Negative for cough, chest tightness and shortness of breath.  Cardiovascular: Negative for chest pain, palpitations and leg swelling.  Gastrointestinal: Negative for abdominal pain, diarrhea, nausea and vomiting.  Genitourinary: Negative for dysuria, flank pain and pelvic pain.  Musculoskeletal: Positive for arthralgias and joint swelling. Negative for myalgias, neck pain and neck stiffness.  Skin: Negative for rash.  Neurological: Positive for headaches. Negative for dizziness and weakness.  All other systems reviewed and are negative.    Physical Exam Updated Vital Signs BP 122/69 (BP Location: Right Arm)   Pulse (!) 110   Temp 98.1 F (36.7 C) (Rectal)   Resp 19   Ht 5\' 5"  (1.651 m)   Wt 72.6 kg (160 lb)   SpO2 98%    BMI 26.63 kg/m   Physical Exam  Constitutional: She appears well-developed and well-nourished. No distress.  Somnolent, falls asleep when talking.  HENT:  Head: Normocephalic.  Oral mucosa is dry  Eyes: Conjunctivae and EOM are normal. Pupils are equal, round, and reactive to light.  Neck: Neck supple.  Cardiovascular: Normal rate, regular rhythm and normal heart sounds.   Pulmonary/Chest: Effort normal and breath sounds normal. No respiratory distress. She has no wheezes. She has no rales.  Abdominal: Soft. Bowel sounds are normal. She exhibits no distension. There is no tenderness. There is no rebound.  Musculoskeletal: She exhibits no edema.  Full range of motion of bilateral shoulders, elbows, right wrist is in a splint, normal range of motion of the left wrist. Full range of motion of bilateral hips, knees, ankles. Right knee diffusely tender, pain with range of motion. Small ulceration to the right heel, no signs of infection.  Neurological: She is alert.  5 out of 5 and equal bilateral grip strength. No pronator drift. Speech slurred.  Skin: Skin is warm and dry.  Psychiatric: She has a normal mood and affect. Her behavior is normal.  Nursing note and vitals reviewed.    ED Treatments / Results  Labs (all labs ordered are listed, but only abnormal results are displayed) Labs Reviewed  URINALYSIS, ROUTINE W REFLEX MICROSCOPIC - Abnormal; Notable for the following:       Result Value   APPearance HAZY (*)    Bacteria, UA RARE (*)    Squamous Epithelial / LPF 0-5 (*)    All other components within normal limits  CBC WITH DIFFERENTIAL/PLATELET - Abnormal; Notable for the following:    WBC 15.9 (*)    RDW 15.6 (*)    Neutro Abs 13.6 (*)    All other components within normal limits  COMPREHENSIVE METABOLIC PANEL - Abnormal; Notable for the following:    CO2 21 (*)    Glucose, Bld 145 (*)    Calcium 8.8 (*)    Total Protein 6.1 (*)    Albumin 3.2 (*)    ALT 9 (*)     Alkaline Phosphatase 130 (*)    Total Bilirubin 1.4 (*)    All other components within normal limits  CULTURE, BLOOD (ROUTINE X 2)  CULTURE, BLOOD (ROUTINE X 2)  MRSA PCR SCREENING  CK  HIV ANTIBODY (ROUTINE TESTING)  I-STAT TROPOININ, ED  I-STAT CG4 LACTIC ACID, ED    EKG  EKG Interpretation  Date/Time:  Tuesday December 11 2016 08:16:08 EDT Ventricular Rate:  115 PR Interval:    QRS Duration: 89 QT Interval:  336 QTC Calculation: 465 R Axis:   48 Text Interpretation:  Sinus tachycardia diffuse ST changes new from previous Confirmed by KOHUT  MD, STEPHEN ([id number]) on 12/11/2016 12:27:32 PM  Radiology Dg Chest 2 View  Result Date: 12/11/2016 CLINICAL DATA:  Status post fall from bed. EXAM: CHEST  2 VIEW COMPARISON:  CT chest 11/22/2016 FINDINGS: Small right pleural effusion. Right lower lobe airspace disease and right middle lobe airspace disease which may reflect atelectasis versus pneumonia. No other focal parenchymal opacity. No left pleural effusion. No pneumothorax. Stable cardiomediastinal silhouette. Osseous structures are unremarkable. IMPRESSION: 1. Small right pleural effusion. Right lower lobe airspace disease and right middle lobe airspace disease which may reflect atelectasis versus pneumonia. Electronically Signed   By: Elige Ko   On: 12/11/2016 09:42   Ct Head Wo Contrast  Result Date: 12/11/2016 CLINICAL DATA:  Unwitnessed fall with trauma to the left side of the face. EXAM: CT HEAD WITHOUT CONTRAST CT CERVICAL SPINE WITHOUT CONTRAST TECHNIQUE: Multidetector CT imaging of the head and cervical spine was performed following the standard protocol without intravenous contrast. Multiplanar CT image reconstructions of the cervical spine were also generated. COMPARISON:  11/22/2016 FINDINGS: CT HEAD FINDINGS Brain: No change since previous study. Old infarction in the cerebellum right worse than left. Old brainstem infarction. Old thalamic infarctions right more than  left. Old basal ganglia infarctions right worse than left. Old right frontal infarction with encephalomalacia and porencephalic a. Chronic small-vessel ischemic changes of the white matter. No hemorrhage. No hydrocephalus. No subdural hematoma. Vascular: There is atherosclerotic calcification of the major vessels at the base of the brain. Skull: Negative Sinuses/Orbits: Clear/normal Other: None significant CT CERVICAL SPINE FINDINGS Alignment: Normal Skull base and vertebrae: No fracture or traumatic finding. Soft tissues and spinal canal: Extensive carotid calcification. Disc levels:  C2-3: Facet arthropathy on the left.  No stenosis. C3-4: Spondylosis. Osteophytic narrowing of the ventral subarachnoid space and neural foramina. C4-5: Spondylosis with osteophytic encroachment upon the ventral subarachnoid space and neural foramina. C5-6: Spondylosis with osteophytic encroachment upon the ventral subarachnoid space and neural foramina. C6-7:  Negative. C7-T1:  Negative. Upper chest: Clear Other: None IMPRESSION: Head CT: No acute or traumatic finding. Extensive old ischemic changes as outlined above. Cervical spine CT: No change. Degenerative spondylosis. No traumatic finding. Electronically Signed   By: Paulina Fusi M.D.   On: 12/11/2016 11:05   Ct Cervical Spine Wo Contrast  Result Date: 12/11/2016 CLINICAL DATA:  Unwitnessed fall with trauma to the left side of the face. EXAM: CT HEAD WITHOUT CONTRAST CT CERVICAL SPINE WITHOUT CONTRAST TECHNIQUE: Multidetector CT imaging of the head and cervical spine was performed following the standard protocol without intravenous contrast. Multiplanar CT image reconstructions of the cervical spine were also generated. COMPARISON:  11/22/2016 FINDINGS: CT HEAD FINDINGS Brain: No change since previous study. Old infarction in the cerebellum right worse than left. Old brainstem infarction. Old thalamic infarctions right more than left. Old basal ganglia infarctions right  worse than left. Old right frontal infarction with encephalomalacia and porencephalic a. Chronic small-vessel ischemic changes of the white matter. No hemorrhage. No hydrocephalus. No subdural hematoma. Vascular: There is atherosclerotic calcification of the major vessels at the base of the brain. Skull: Negative Sinuses/Orbits: Clear/normal Other: None significant CT CERVICAL SPINE FINDINGS Alignment: Normal Skull base and vertebrae: No fracture or traumatic finding. Soft tissues and spinal canal: Extensive carotid calcification. Disc levels:  C2-3: Facet arthropathy on the left.  No stenosis. C3-4: Spondylosis. Osteophytic narrowing of the ventral subarachnoid space and neural foramina. C4-5: Spondylosis with osteophytic encroachment upon the ventral subarachnoid space and neural foramina. C5-6: Spondylosis with osteophytic encroachment upon the ventral subarachnoid space and  neural foramina. C6-7:  Negative. C7-T1:  Negative. Upper chest: Clear Other: None IMPRESSION: Head CT: No acute or traumatic finding. Extensive old ischemic changes as outlined above. Cervical spine CT: No change. Degenerative spondylosis. No traumatic finding. Electronically Signed   By: Paulina Fusi M.D.   On: 12/11/2016 11:05   Dg Knee Complete 4 Views Left  Result Date: 12/11/2016 CLINICAL DATA:  Left knee pain secondary to a fall from bed last night. EXAM: LEFT KNEE - COMPLETE 4+ VIEW COMPARISON:  None. FINDINGS: There is no fracture or dislocation. There is a small joint effusion. Arterial calcifications around the knee. IMPRESSION: No acute bone abnormality.  Small joint effusion. Electronically Signed   By: Francene Boyers M.D.   On: 12/11/2016 09:44   Dg Knee Complete 4 Views Right  Result Date: 12/11/2016 CLINICAL DATA:  Knee pain secondary to a fall from bed last night. EXAM: RIGHT KNEE - COMPLETE 4+ VIEW COMPARISON:  None. FINDINGS: No evidence of fracture, dislocation, or joint effusion. Minimal medial joint space  narrowing. Arterial vascular calcification around the knee. IMPRESSION: No acute abnormality. Electronically Signed   By: Francene Boyers M.D.   On: 12/11/2016 09:43    Procedures Procedures (including critical care time)  Medications Ordered in ED Medications  phenytoin (DILANTIN) chewable tablet 100 mg (not administered)  phenytoin (DILANTIN) chewable tablet 200 mg (200 mg Oral Given 12/11/16 1653)  aspirin EC tablet 81 mg (81 mg Oral Given 12/11/16 1653)  atorvastatin (LIPITOR) tablet 80 mg (not administered)  ticagrelor (BRILINTA) tablet 90 mg (not administered)  citalopram (CELEXA) tablet 10 mg (not administered)  metoprolol tartrate (LOPRESSOR) tablet 100 mg (not administered)  nicotine (NICODERM CQ - dosed in mg/24 hours) patch 14 mg (14 mg Transdermal Patch Applied 12/11/16 1653)  predniSONE (DELTASONE) tablet 2.5 mg (not administered)  mometasone-formoterol (DULERA) 100-5 MCG/ACT inhaler 2 puff (not administered)  acetaminophen (TYLENOL) tablet 650 mg (not administered)  albuterol (PROVENTIL) (2.5 MG/3ML) 0.083% nebulizer solution 2.5 mg (not administered)  enoxaparin (LOVENOX) injection 40 mg (not administered)  senna-docusate (Senokot-S) tablet 1 tablet (not administered)  sodium chloride 0.9 % bolus 1,000 mL (0 mLs Intravenous Stopped 12/11/16 1239)  levofloxacin (LEVAQUIN) IVPB 500 mg (0 mg Intravenous Stopped 12/11/16 1231)     Initial Impression / Assessment and Plan / ED Course  I have reviewed the triage vital signs and the nursing notes.  Pertinent labs & imaging results that were available during my care of the patient were reviewed by me and considered in my medical decision making (see chart for details).     Pt seen and examined. Pt with unwhittnessed fall, solmonolent on arrival, denies much pain other than right knee. Pt is tachycardic, normal rectal teamp, appears dry. Will check labs, CT head and cervical spine, CXR due to mild hypoxia, right knee film   11:41  AM Spoke with Daughter Marylene Land [phone], not baseline mental status for pt.  Pt continues to be solmnolent. Zx-ray showed a pneumonia vs atelectasis. Covering with levaquin. Discussed with teaching service, they will admit patient for altered mental status and pneumonia. Question acute CVA? Vs infectious process. HR improved with IV fluids.     Vitals:   12/11/16 1100 12/11/16 1130  BP: 126/77 (!) 144/69  Pulse: 88 92  Resp: 15 16  Temp:       Final Clinical Impressions(s) / ED Diagnoses   Final diagnoses:  Fall  Community acquired pneumonia of right lower lobe of lung (HCC)  Altered mental  status, unspecified altered mental status type    New Prescriptions Current Discharge Medication List       Jaynie Crumble, Cordelia Poche 12/11/16 1654    Raeford Razor, MD 12/21/16 Nicholos Johns

## 2016-12-11 NOTE — ED Notes (Signed)
Phlebotomy at bedside at this time.

## 2016-12-11 NOTE — ED Notes (Signed)
Transported to CT 

## 2016-12-11 NOTE — H&P (Signed)
Date: 12/11/2016               Patient Name:  Sandra Rice MRN: [id number]  DOB: Oct 25, 1955 Age / Sex: 61 y.o., female   PCP: Sandra Bachelor, MD         Medical Service: Internal Medicine Teaching Service         Attending Physician: Dr. Gust Rung, DO    First Contact: Dr. Obie Rice [phone]  Second Contact: Dr. 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 At her assisted living facility they perform rounds every 2 hours this morning she was found. In her bed sheets on the side of her bed. At that time her mental status was at baseline and she had no complaints of pain until she was lifted onto the backboard by EMS. She doesn't remember waking up but she wouldn't mind if been trying to get out of bed and think she could've gotten tangled in the sheets. Is hard for her to say exactly what happened. She denies chest pain, difficulty breathing, dizziness, fever, chills, syncope, changes in vision, weakness and she did not bite her tongue or have incontinence. She did not hit her head. They are at her rehabilitation shows that she's been getting Norco 5-325 mg twice daily that she didn't ask for any additional when necessary medications last night.   In the ED she had tachycardia with rates 90 to 110s and was afebrile. Labs revealed a bicarbonate 21, leukocytosis 15.9 with neutrophil predominance, poc Troponin 0.04, lactic acid 1.26, urinalysis without nitrites, leukocytes or bacteria. EKG showed diffuse T-wave inversions are new from prior June 7. Chest x-ray showed increased  pulmonary interstitial markings, right sided pleural effusion, and rib fractures. Bilateral knee x-rays were negative for acute pathology. CT head and neck showed no acute fracture or intracranial abnormalities. Blood cultures were drawn. She received a dose of levofloxacin in 1 L normal saline bolus.  Meds:  Current Meds  Medication Sig  . acetaminophen (TYLENOL) 325 MG tablet Take 650 mg by mouth every 8 (eight) hours as needed. Pain/fever   . albuterol (PROVENTIL) (2.5 MG/3ML) 0.083% nebulizer solution Take 2.5 mg by nebulization every 4 (four) hours as needed for wheezing or shortness of breath.  Marland Kitchen amLODipine (NORVASC) 5 MG tablet Take 5 mg by mouth daily.  Marland Kitchen aspirin 81 MG chewable tablet Chew 81 mg by mouth daily.  Marland Kitchen atorvastatin (LIPITOR) 80 MG tablet Take 80 mg by mouth at bedtime.  . budesonide-formoterol (SYMBICORT) 160-4.5 MCG/ACT inhaler Inhale 2 puffs into the lungs 2 (two) times daily.  . Calcium Carb-Cholecalciferol (CALCIUM-VITAMIN D) 600-400 MG-UNIT TABS Take 1 tablet by mouth 2 (two) times daily.  . citalopram (CELEXA) 10 MG tablet Take 10 mg by mouth daily.  Marland Kitchen HYDROcodone-acetaminophen (NORCO) 5-325 MG per tablet Take 1 tablet by mouth 2 (two) times daily. pain  . lisinopril (PRINIVIL,ZESTRIL) 5 MG tablet Take 5 mg by mouth 2 (two) times daily.  . metoprolol tartrate (LOPRESSOR) 100 MG tablet Take 100 mg by mouth 2 (two) times daily.  Marland Kitchen  Multiple Vitamins-Minerals (MULTIVITAMIN WITH MINERALS) tablet Take 1 tablet by mouth daily.    . nicotine (NICODERM CQ - DOSED IN MG/24 HOURS) 14 mg/24hr patch Place 14 mg onto the skin daily.  . nitroGLYCERIN (NITROSTAT) 0.4 MG SL tablet Place 0.4 mg under the tongue every 5 (five) minutes as needed for chest pain.  Marland Kitchen oxyCODONE-acetaminophen (PERCOCET/ROXICET) 5-325 MG tablet Take 1-2 tablets by mouth every 6 (six) hours as needed for moderate pain or severe pain.  . phenytoin (DILANTIN) 100 MG ER capsule Take 100-200 mg by mouth See admin  instructions. Take 100 mg every morning  Take 200 mg at bedtime  . potassium chloride (KLOR-CON) 10 MEQ CR tablet Take 10 mEq by mouth daily.    . predniSONE (DELTASONE) 2.5 MG tablet Take 2.5 mg by mouth daily with breakfast.  . ticagrelor (BRILINTA) 90 MG TABS tablet Take 90 mg by mouth 2 (two) times daily.     Allergies: Allergies as of 12/11/2016  . (No Known Allergies)   Past Medical History:  Diagnosis Date  . Coronary artery disease   . GERD (gastroesophageal reflux disease)   . Hypertension   . Myocardial infarction (HCC)   . Renal artery stenosis (HCC)   . Stroke [facility])     Family History:  Denies family history of MI.   Social History: Smoked 1 pack per day for the majority of her life quit smoking 2 years ago. Denies alcohol or illicit drug use. She has a history of alcohol abuse and crack cocaine use. Now she lives at [facility] she moved there about two months ago from [facility].   Review of Systems: A complete ROS was negative except as per HPI.   Physical Exam: Blood pressure 129/64, pulse 93, temperature 98.1 F (36.7 C), resp. rate 18, height 5\' 2"  (1.575 m), weight 157 lb 1.6 oz (71.3 kg), SpO2 94 %. Physical Exam  Constitutional: She is well-developed, well-nourished, and in no distress. No distress.  HENT:  Head: Normocephalic and atraumatic.  Eyes: Conjunctivae are normal. Right eye exhibits no discharge. Left eye exhibits no discharge. No scleral icterus.  Cardiovascular: Normal rate and regular rhythm.   No murmur heard. Pulmonary/Chest: Effort normal and breath sounds normal. No respiratory distress. She has no wheezes.  Abdominal: Soft. Bowel sounds are normal. She exhibits no distension. There is no guarding.  Musculoskeletal:  She is wearing a brace over her right arm  Neurological: She is alert.  Speech is slurred this is at baseline since her prior stroke, strength in upper and lower extremities equal and intact.  Oriented to person, and place (knows the year is 2018 but not the month or season and says the president is obama)    Skin: Skin is warm and dry. She is not diaphoretic.   EKG: Personally reviewed the EKG shows, diffuse T wave inversions, new from prior EKG   CXR: Personally reviewed the chest x-ray reveals right pleural effusion  Assessment & Plan by Problem:    Fall   H/O: stroke Patient presents from her nursing home after what sounds like a mechanical fall. She denies chest pain and point of care troponin was 0.04. EKG that she had in the ED was unclear so we have ordered a repeat of this. She has a history of multiple strokes in the past these occurred 8 years ago and left her with residual slurred speech and cognitive deficits. Today she has no new neurologic deficits and CT  of the head was unremarkable. Sounds like she's had a harder time taking care of herself since moving from long-term care to an assisted living facility 2 months ago she's had multiple mechanical falls since then with the recent one resulting in rib fracture. Today she has no complaints of pain suggestive of fracture. -Follow-up EKG  - Monitor overnight Continue home medications Brilinta aspirin - continue home atorvastatin 80 mg  Continue home medication Dilantin for seizure prophylaxis -delirium precautions  - PT and OT evaluation and treatment  - SW consult - daughter would like for her to go to [facility] long-term care facility after this discharge is status where she works and she has a bed ready there  ?Pneumonia  Chest x-ray in the ED was read as possible right middle lobe pneumonia however she is afebrile. Does have leukocytosis which may be explained by her chronic steroid use for COPD. She does have a cough which she said began after her stroke and has not worsened recently. She does not appear septic and received a dose of levofloxacin in the ED. Follow-up morning chest x-ray  COPD She is not  wheezing or in respiratory distress on exam. Ordered home meds albuterol, dulera (she is on symbicort at home), and prednisone 2.5 mg daily.   Hypertension Currently her systolic blood pressures in the 115 we will hold home medications amlodipine 5 mg and lisinopril 5. She is tachycardic so we will restart her metoprolol tartrate.  Need for HIV antibody screening She was negative for HIV in 2009, we will repeat screening today. -Follow up HIV   Dispo: Admit patient to Observation with expected length of stay less than 2 midnights.  Signed: Eulah Pont, MD 12/11/2016, 4:56 PM  Pager: [phone]

## 2016-12-11 NOTE — ED Triage Notes (Signed)
Pt arrives from long term care facility after being found this am beside of her bed laying on the floor. Pt had unwitnessed fall at an unknown time last seen normal last night. Pt is alert and oriented to person and place. Pt has abrasion to left cheek above left eye, pt c/o of left leg pain.

## 2016-12-12 ENCOUNTER — Observation Stay (HOSPITAL_COMMUNITY): Payer: Medicare Other

## 2016-12-12 DIAGNOSIS — I700 Atherosclerosis of aorta: Secondary | ICD-10-CM | POA: Diagnosis not present

## 2016-12-12 DIAGNOSIS — M25561 Pain in right knee: Secondary | ICD-10-CM | POA: Diagnosis not present

## 2016-12-12 DIAGNOSIS — I69319 Unspecified symptoms and signs involving cognitive functions following cerebral infarction: Secondary | ICD-10-CM | POA: Diagnosis not present

## 2016-12-12 DIAGNOSIS — J9811 Atelectasis: Secondary | ICD-10-CM | POA: Diagnosis not present

## 2016-12-12 DIAGNOSIS — R9431 Abnormal electrocardiogram [ECG] [EKG]: Secondary | ICD-10-CM | POA: Diagnosis not present

## 2016-12-12 DIAGNOSIS — J441 Chronic obstructive pulmonary disease with (acute) exacerbation: Secondary | ICD-10-CM | POA: Insufficient documentation

## 2016-12-12 DIAGNOSIS — R4182 Altered mental status, unspecified: Secondary | ICD-10-CM | POA: Diagnosis not present

## 2016-12-12 DIAGNOSIS — W19XXXA Unspecified fall, initial encounter: Secondary | ICD-10-CM | POA: Diagnosis not present

## 2016-12-12 DIAGNOSIS — J90 Pleural effusion, not elsewhere classified: Secondary | ICD-10-CM

## 2016-12-12 DIAGNOSIS — J449 Chronic obstructive pulmonary disease, unspecified: Secondary | ICD-10-CM

## 2016-12-12 LAB — BASIC METABOLIC PANEL
Anion gap: 10 (ref 5–15)
BUN: 9 mg/dL (ref 6–20)
CALCIUM: 8.6 mg/dL — AB (ref 8.9–10.3)
CO2: 22 mmol/L (ref 22–32)
Chloride: 106 mmol/L (ref 101–111)
Creatinine, Ser: 0.71 mg/dL (ref 0.44–1.00)
Glucose, Bld: 113 mg/dL — ABNORMAL HIGH (ref 65–99)
Potassium: 3.8 mmol/L (ref 3.5–5.1)
SODIUM: 138 mmol/L (ref 135–145)

## 2016-12-12 LAB — CBC
HCT: 33.5 % — ABNORMAL LOW (ref 36.0–46.0)
Hemoglobin: 10.2 g/dL — ABNORMAL LOW (ref 12.0–15.0)
MCH: 28.2 pg (ref 26.0–34.0)
MCHC: 30.4 g/dL (ref 30.0–36.0)
MCV: 92.5 fL (ref 78.0–100.0)
PLATELETS: 204 10*3/uL (ref 150–400)
RBC: 3.62 MIL/uL — AB (ref 3.87–5.11)
RDW: 15.4 % (ref 11.5–15.5)
WBC: 9.1 10*3/uL (ref 4.0–10.5)

## 2016-12-12 LAB — HIV ANTIBODY (ROUTINE TESTING W REFLEX): HIV SCREEN 4TH GENERATION: NONREACTIVE

## 2016-12-12 NOTE — Progress Notes (Signed)
Patient will DC to: [facility] Anticipated DC date: 12/12/16 Family notified: Daughter Transport by: Sharin Mons (2 hours behind)   Per MD patient ready for DC to [facility]. RN, patient, patient's family, and facility notified of DC. Discharge Summary sent to facility. RN given number for report [phone]). DC packet on chart. Ambulance transport requested for patient.   CSW signing off.  Cristobal Goldmann, [place] Clinical Social Worker [phone]

## 2016-12-12 NOTE — Clinical Social Work Placement (Signed)
   CLINICAL SOCIAL WORK PLACEMENT  NOTE  Date:  12/12/2016  Patient Details  Name: Sandra Rice MRN: [id number] Date of Birth: [birth year]  Clinical Social Work is seeking post-discharge placement for this patient at the Skilled  Nursing Facility level of care (*CSW will initial, date and re-position this form in  chart as items are completed):  Yes   Patient/family provided with [facility] Clinical Social Work Department's list of facilities offering this level of care within the geographic area requested by the patient (or if unable, by the patient's family).  Yes   Patient/family informed of their freedom to choose among providers that offer the needed level of care, that participate in Medicare, Medicaid or managed care program needed by the patient, have an available bed and are willing to accept the patient.  Yes   Patient/family informed of [facility]'s ownership interest in [facility] and [facility], as well as of the fact that they are under no obligation to receive care at these facilities.  PASRR submitted to EDS on 12/12/16     PASRR number received on 12/12/16     Existing PASRR number confirmed on       FL2 transmitted to all facilities in geographic area requested by pt/family on 12/12/16     FL2 transmitted to all facilities within larger geographic area on       Patient informed that his/her managed care company has contracts with or will negotiate with certain facilities, including the following:        Yes   Patient/family informed of bed offers received.  Patient chooses bed at [facility]     Physician recommends and patient chooses bed at      Patient to be transferred to [facility] on 12/12/16.  Patient to be transferred to facility by [facility]AR     Patient family notified on 12/12/16 of transfer.  Name of family member notified:  Daughter     PHYSICIAN       Additional Comment:     _______________________________________________ Mearl Latin, LCSWA 12/12/2016, 4:24 PM

## 2016-12-12 NOTE — Clinical Social Work Note (Signed)
Clinical Social Work Assessment  Patient Details  Name: Sandra Rice MRN: [id number] Date of Birth: [birth year]  Date of referral:  12/12/16               Reason for consult:  Facility Placement                Permission sought to share information with:  Facility Medical sales representative, Family Supports Permission granted to share information::  Yes, Verbal Permission Granted  Name::     Angie  Agency::  [facility]  Relationship::  Daughter  Contact Information:  [phone]  Housing/Transportation Living arrangements for the past 2 months:  Assisted Living Facility Source of Information:  Adult Children, Patient Patient Interpreter Needed:  None Criminal Activity/Legal Involvement Pertinent to Current Situation/Hospitalization:  No - Comment as needed Significant Relationships:  Adult Children Lives with:  Facility Resident Do you feel safe going back to the place where you live?  No Need for family participation in patient care:  Yes (Comment)  Care giving concerns:  CSW received consult for possible SNF placement at time of discharge. CSW spoke with patient and her daughter regarding recommendation of SNF placement at time of discharge. Patient's daughter reported that patient was residing at [facility], but she would like for patient to discharge to [facility] instead since the patient's daughter works there. CSW to continue to follow and assist with discharge planning needs.   Social Worker assessment / plan:  CSW spoke with patient concerning possibility of transitioning to SNF.  Employment status:  Retired Health and safety inspector:  Medicaid [place] PT Recommendations:  Not assessed at this time Information / Referral to community resources:  Skilled Nursing Facility  Patient/Family's Response to care:  Patient's daughter would like for patient to go to go to [facility] instead of back to ALF, so patient's daughter can watch out for patient.    Patient/Family's Understanding of and Emotional Response to Diagnosis, Current Treatment, and Prognosis:  Patient/family is realistic regarding therapy needs and expressed being hopeful for SNF placement. Patient's daughter expressed understanding of CSW role and discharge process as well as her mother's medical condition. No questions/concerns about plan or treatment.    Emotional Assessment Appearance:  Appears stated age Attitude/Demeanor/Rapport:  Other (Appropriate) Affect (typically observed):  Accepting, Appropriate Orientation:  Oriented to Self, Oriented to Situation, Oriented to Place, Oriented to  Time Alcohol / Substance use:  Not Applicable Psych involvement (Current and /or in the community):  No (Comment)  Discharge Needs  Concerns to be addressed:  Care Coordination Readmission within the last 30 days:  No Current discharge risk:  None Barriers to Discharge:  Continued Medical Work up   [employer], LCSWA 12/12/2016, 8:59 AM

## 2016-12-12 NOTE — Evaluation (Signed)
Physical Therapy Evaluation Patient Details Name: Sandra Rice MRN: [id number] DOB: 1955-12-01 Today's Date: 12/12/2016   History of Present Illness  61 year old female with past medical history of CVA, hypertension, COPD. She presented to her ED after a fall at her assisted living facility.  During hospitilization patient with fall in room.  Clinical Impression  Orders received for PT evaluation. Patient demonstrates deficits in functional mobility as indicated below. Will benefit from continued skilled PT to address deficits and maximize function. Will see as indicated and progress as tolerated.  Given patient history of multiple recurring falls resulting in injury, feel patient would benefit from continued post acute rehabilitation. Recommend ST SNF at this time to address balance deficits and safety.     Follow Up Recommendations SNF;Supervision for mobility/OOB    Equipment Recommendations  None recommended by PT    Recommendations for Other Services       Precautions / Restrictions Precautions Precautions: Fall Required Braces or Orthoses: Other Brace/Splint (RUE wrist splint) Restrictions Weight Bearing Restrictions: No      Mobility  Bed Mobility Overal bed mobility: Needs Assistance Bed Mobility: Supine to Sit;Sit to Supine     Supine to sit: Supervision Sit to supine: Supervision   General bed mobility comments: no physical assist required, increased time to perform. Cues for safety  Transfers Overall transfer level: Needs assistance Equipment used: Rolling walker (2 wheeled) Transfers: Sit to/from [employer] Sit to Stand: Min assist Stand pivot transfers: Min guard       General transfer comment: min assist to power up from low surface with manual stabilization of RW. VCs for hand placement and positioning. Min guard for pivotal transfer to [facility].  Ambulation/Gait Ambulation/Gait assistance: Min guard Ambulation Distance (Feet): 3  Feet Assistive device: Rolling walker (2 wheeled) Gait Pattern/deviations: Step-to pattern;Antalgic Gait velocity: decreased Gait velocity interpretation: Below normal speed for age/gender General Gait Details: limited by pain in LLE (knee), deferred further ambulation at this time  Stairs            Wheelchair Mobility    Modified Rankin (Stroke Patients Only)       Balance Overall balance assessment: Needs assistance;History of Falls Sitting-balance support: Feet supported Sitting balance-Leahy Scale: Fair Sitting balance - Comments: able to sit EOB without assist     Standing balance-Leahy Scale: Poor Standing balance comment: reliance on BIlateral UE support                             Pertinent Vitals/Pain Pain Assessment: Faces Faces Pain Scale: Hurts even more Pain Location: right wrist, left knee Pain Descriptors / Indicators: Grimacing;Guarding;Sore;Tender Pain Intervention(s): Limited activity within patient's tolerance;Monitored during session;Repositioned;Ice applied    Home Living Family/patient expects to be discharged to:: Assisted living               Home Equipment: Dan Humphreys - 2 wheels Additional Comments: originally at assisted living at [facility]    Prior Function Level of Independence: Needs assistance         Comments: At assisted living at [facility], patient reports that she ambulated with RW and no assist around her facility. Per chart review, patient with history of multiple falls recently while at assisted living facility     Hand Dominance   Dominant Hand: Right    Extremity/Trunk Assessment   Upper Extremity Assessment Upper Extremity Assessment: RUE deficits/detail RUE Deficits / Details: limited assessment due to pain in  right wrist (splinted) RUE: Unable to fully assess due to pain;Unable to fully assess due to immobilization RUE Coordination: decreased fine motor;decreased gross motor    Lower Extremity  Assessment Lower Extremity Assessment: Generalized weakness;LLE deficits/detail LLE Deficits / Details: noted increased edema and bruising over anterior knee, tender to palpation, warm to touch LLE: Unable to fully assess due to pain LLE Coordination: decreased fine motor    Cervical / Trunk Assessment Cervical / Trunk Assessment: Kyphotic  Communication   Communication: HOH  Cognition Arousal/Alertness: Awake/alert Behavior During Therapy: Impulsive Overall Cognitive Status: No family/caregiver present to determine baseline cognitive functioning Area of Impairment: Attention;Following commands;Safety/judgement;Awareness;Problem solving;Memory                   Current Attention Level: Sustained Memory: Decreased short-term memory Following Commands: Follows one step commands consistently;Follows multi-step commands consistently Safety/Judgement: Decreased awareness of safety;Decreased awareness of deficits Awareness: Emergent Problem Solving: Requires verbal cues;Requires tactile cues        General Comments      Exercises     Assessment/Plan    PT Assessment Patient needs continued PT services  PT Problem List Decreased strength;Decreased activity tolerance;Decreased balance;Decreased mobility;Decreased safety awareness;Pain       PT Treatment Interventions DME instruction;Gait training;Functional mobility training;Therapeutic activities;Therapeutic exercise;Balance training;Cognitive remediation;Patient/family education;Modalities    PT Goals (Current goals can be found in the Care Plan section)  Acute Rehab PT Goals Patient Stated Goal: to not fall PT Goal Formulation: With patient Time For Goal Achievement: 12/26/16 Potential to Achieve Goals: Good    Frequency Min 2X/week   Barriers to discharge        Co-evaluation PT/OT/SLP Co-Evaluation/Treatment: Yes Reason for Co-Treatment: For patient/therapist safety (recent fall prior to therapy team  arrival) PT goals addressed during session: Mobility/safety with mobility         AM-PAC PT "6 Clicks" Daily Activity  Outcome Measure Difficulty turning over in bed (including adjusting bedclothes, sheets and blankets)?: A Little Difficulty moving from lying on back to sitting on the side of the bed? : A Little Difficulty sitting down on and standing up from a chair with arms (e.g., wheelchair, bedside commode, etc,.)?: A Little Help needed moving to and from a bed to chair (including a wheelchair)?: A Lot Help needed walking in hospital room?: A Lot Help needed climbing 3-5 steps with a railing? : A Lot 6 Click Score: 15    End of Session Equipment Utilized During Treatment: Gait belt Activity Tolerance: Patient limited by pain Patient left: in bed;with call bell/phone within reach;with bed alarm set;Other (comment) (low bed) Nurse Communication: Mobility status;Precautions PT Visit Diagnosis: Unsteadiness on feet (R26.81);Repeated falls (R29.6)    Time: 1610-9604 PT Time Calculation (min) (ACUTE ONLY): 22 min   Charges:   PT Evaluation $PT Eval Moderate Complexity: 1 Procedure     PT G Codes:   PT G-Codes **NOT FOR INPATIENT CLASS** Functional Assessment Tool Used: Clinical judgement Functional Limitation: Mobility: Walking and moving around Mobility: Walking and Moving Around Current Status (V4098): At least 20 percent but less than 40 percent impaired, limited or restricted Mobility: Walking and Moving Around Goal Status [phone]): At least 1 percent but less than 20 percent impaired, limited or restricted    Charlotte Crumb, PT DPT NCS [phone]   Fabio Asa 12/12/2016, 3:43 PM

## 2016-12-12 NOTE — Progress Notes (Signed)
Pt prepared for d/c to SNF. IV d/c'd. Skin intact except as charted in most recent assessments. Vitals are stable. Report called to receiving facility. Pt to be transported by ambulance service. 

## 2016-12-12 NOTE — Progress Notes (Signed)
   Subjective: Patient says she is still feeling well today and denies cough, chest pain, weakness or dizziness when she was assisted to the bedside commode. She is lying in the bed at the lowest level with a padded floor cushion beside her.   Objective:  Vital signs in last 24 hours: Vitals:   12/11/16 1510 12/11/16 1525 12/11/16 2217 12/12/16 0455  BP: 129/64  (!) 145/73 101/61  Pulse: 93  98 72  Resp: 18  18 18   Temp: 98.1 F (36.7 C)  98.4 F (36.9 C) 98.5 F (36.9 C)  TempSrc:   Oral Oral  SpO2: 94%  93% 100%  Weight: 157 lb 1.6 oz (71.3 kg) 157 lb 1.6 oz (71.3 kg)    Height: 5\' 2"  (1.575 m) 5\' 2"  (1.575 m)    Physical Exam  Constitutional: She is oriented to person, place, and time. She appears well-developed and well-nourished. No distress.  Appears older than stated age  HENT:  Head: Normocephalic and atraumatic.  Cardiovascular: Normal rate and regular rhythm.   No murmur heard. 1+ lower extremity pitting edema  Pulmonary/Chest: Effort normal. No respiratory distress. She has no wheezes. She has no rales.  Abdominal: Soft. Bowel sounds are normal. She exhibits no distension. There is no tenderness. There is no guarding.  Neurological: She is alert and oriented to person, place, and time.  Skin: Skin is warm and dry. She is not diaphoretic.   Assessment/Plan:    Fall Ms. Madrid had no further dizziness and denies chest pain or difficulty breathing overnight. CT head did not have new intracranial abnormalities and she has no new neurologic deficits.  - F/U PT/ OT evaluation and treatment  - I agree with the daughters request for discharged to long term care facility     H/O: stroke - continue home meds brilinta and aspirin  - continue dilantin   EKG changes  EKG does show some T wave inversions which are new but troponin in the ED was 0.04 and she has no chest pain or shortness of breath and denies LOC during the fall.  - continue to monitor  - continue  atorvastatin 80 mg     Hypertension She remains normotensive today. We'll continue to follow blood pressure and hold her home medications amlodipine and lisinopril. If her blood pressure remains well controlled and he should be discontinued at discharge because she did present after a fall and reported some dizziness with standing leading up to the fall. Continue metoprolol tartrate as this seems to be controlling her heart rate.  COPD Not wheezing, no respiratory distress on exam . -Continue home albuterol when necessary and to wear for Symbicort replacement.  -Her home meds list included prednisone 2.5 mg daily it looks like this is an extension of a 10 day course which was prescribed months ago. Will initiate taper of this medication.  Pleural effusion This is a unilateral right-sided pleural effusion which could be explained by refracture on the area and some resulting atelectasis. She denies symptoms related to this. Follow-up chest x-ray in 3 months   Follow-up HIV discharge  Dispo: Anticipated discharge today  Eulah Pont, MD 12/12/2016, 10:43 AM Pager: [phone]

## 2016-12-12 NOTE — NC FL2 (Signed)
[place] MEDICAID FL2 LEVEL OF CARE SCREENING TOOL     IDENTIFICATION  Patient Name: Sandra Rice Birthdate: January 22, 1956 Sex: female Admission Date (Current Location): 12/11/2016  [facility] and [place] Number:  Producer, television/film/video and Address:  [facility], [address], [place], [place] [address]      Provider Number: [id number]  Attending Physician Name and Address:  Gust Rung, DO  Relative Name and Phone Number:  Karoline Caldwell, daughter, [phone]    Current Level of Care: Hospital Recommended Level of Care: Skilled Nursing Facility Prior Approval Number:    Date Approved/Denied:   PASRR Number: [id number]  Discharge Plan: SNF    Current Diagnoses: Patient Active Problem List   Diagnosis Date Noted  . Fall 12/11/2016  . H/O: stroke 12/11/2016  . Hypertension 12/11/2016  . Hyperlipidemia 07/28/2008    Orientation RESPIRATION BLADDER Height & Weight     Self, Time, Situation, Place  Normal Continent Weight: 71.3 kg (157 lb 1.6 oz) Height:  5\' 2"  (157.5 cm)  BEHAVIORAL SYMPTOMS/MOOD NEUROLOGICAL BOWEL NUTRITION STATUS      Continent Diet (Please see DC Summary)  AMBULATORY STATUS COMMUNICATION OF NEEDS Skin   Limited Assist Verbally Normal                       Personal Care Assistance Level of Assistance  Bathing, Feeding, Dressing Bathing Assistance: Limited assistance Feeding assistance: Independent Dressing Assistance: Limited assistance     Functional Limitations Info             SPECIAL CARE FACTORS FREQUENCY  PT (By licensed PT)     PT Frequency: not yet assessed              Contractures      Additional Factors Info  Code Status, Allergies, Isolation Precautions Code Status Info: DNR Allergies Info: NKA     Isolation Precautions Info: Contact precautions     Current Medications (12/12/2016):  This is the current hospital active medication list Current Facility-Administered Medications   Medication Dose Route Frequency Provider Last Rate Last Dose  . acetaminophen (TYLENOL) tablet 650 mg  650 mg Oral Q6H PRN Eulah Pont, MD      . albuterol (PROVENTIL) (2.5 MG/3ML) 0.083% nebulizer solution 2.5 mg  2.5 mg Nebulization Q6H PRN Eulah Pont, MD      . aspirin EC tablet 81 mg  81 mg Oral Daily Eulah Pont, MD   81 mg at 12/11/16 1653  . atorvastatin (LIPITOR) tablet 80 mg  80 mg Oral q1800 Eulah Pont, MD   80 mg at 12/11/16 1654  . Chlorhexidine Gluconate Cloth 2 % PADS 6 each  6 each Topical Q0600 Gust Rung, DO   6 each at 12/12/16 0543  . citalopram (CELEXA) tablet 10 mg  10 mg Oral Daily Eulah Pont, MD   10 mg at 12/11/16 1654  . enoxaparin (LOVENOX) injection 40 mg  40 mg Subcutaneous Q24H Eulah Pont, MD   40 mg at 12/11/16 2213  . metoprolol tartrate (LOPRESSOR) tablet 100 mg  100 mg Oral BID Eulah Pont, MD   100 mg at 12/11/16 2217  . mometasone-formoterol (DULERA) 100-5 MCG/ACT inhaler 2 puff  2 puff Inhalation BID Eulah Pont, MD   2 puff at 12/12/16 [phone]  . mupirocin ointment (BACTROBAN) 2 % 1 application  1 application Nasal BID Gust Rung, DO   1 application at 12/11/16 2215  . nicotine (NICODERM CQ -  dosed in mg/24 hours) patch 14 mg  14 mg Transdermal Daily Eulah Pont, MD   14 mg at 12/11/16 1653  . phenytoin (DILANTIN) chewable tablet 100 mg  100 mg Oral q morning - 10a Eulah Pont, MD      . phenytoin (DILANTIN) chewable tablet 200 mg  200 mg Oral QPM Eulah Pont, MD   200 mg at 12/11/16 1653  . predniSONE (DELTASONE) tablet 2.5 mg  2.5 mg Oral Q breakfast Eulah Pont, MD      . senna-docusate (Senokot-S) tablet 1 tablet  1 tablet Oral QHS PRN Eulah Pont, MD      . ticagrelor [facility]) tablet 90 mg  90 mg Oral BID Eulah Pont, MD   90 mg at 12/11/16 1654     Discharge Medications: Please see discharge summary for a list of discharge medications.  Relevant Imaging Results:  Relevant Lab Results:   Additional Information SSN: [id number] [place],  [place]

## 2016-12-12 NOTE — Progress Notes (Signed)
I received a call that Sandra Rice had fallen in her room.She says she thought she heard her daughter in the hall and when she got out of her bed to walk towards the door she tripped and fell over the trash can. She denies weakness, dizziness, chest pain, or palpitations at the time of the fall. She was found sitting on the floor and did not hit her head or lose consciousness. Her bed was at the lowest level and she had a cushion padding the floor beside her bed. Call bell was in reach and bed alarm was on.   On my exam she has an ice pack over her left knee, there is an effusion and tenderness to palpation over the anterior knee.   Plan  - Ordered knee xray  - continue to monitor and use fall precautions

## 2016-12-12 NOTE — Discharge Instructions (Signed)
Sandra Rice,   It has been a pleasure working with you and we are glad you're feeling better.   For your dizziness,  Stop taking lisinopril and amlodipine if you are found to have High blood pressure these may need to be continued  Follow up with your primary care provider in 1-2 weeks  If your symptoms worsen or you develop new symptoms, please seek medical help whether it is your primary care provider or emergency department.  If you have any questions about this hospitalization please call [phone].

## 2016-12-12 NOTE — Progress Notes (Signed)
12/12/16 1345  What Happened  Was fall witnessed? No  Was patient injured? Yes (L knee)  Patient found on floor  Found by Staff-comment  Stated prior activity ambulating-unassisted (Pt stated she was trying to get to door)  Follow Up  MD notified Dr Obie Dredge  Time MD notified [phone]  Family notified Yes-comment  Time family notified 1740  Additional tests Yes-comment (L knee Xray)  Simple treatment Ice  Progress note created (see row info) Yes  Adult Fall Risk Assessment  Risk Factor Category (scoring not indicated) History of more than one fall within 6 months before admission (document High fall risk);Fall has occurred during this admission (document High fall risk);High fall risk per protocol (document High fall risk)  Patient's Fall Risk High Fall Risk (>13 points)  Adult Fall Risk Interventions  Required Bundle Interventions *See Row Information* High fall risk - low, moderate, and high requirements implemented  Additional Interventions Room near nurses station;Use of appropriate toileting equipment (bedpan, BSC, etc.)  Screening for Fall Injury Risk  Risk For Fall Injury- See Row Information  Bones - fracture risk  Required Injury Bundle Interventions *See Row Information* Injury Bundle Implemented  Pain Assessment  Pain Assessment 0-10  Pain Score 3  Pain Type Acute pain  Pain Location Knee  Pain Orientation Left  Pain Descriptors / Indicators Sore  Pain Frequency Intermittent  Pain Onset Sudden  Pain Intervention(s) Cold applied  Multiple Pain Sites No  PCA/Epidural/Spinal Assessment  Respiratory Pattern Regular;Unlabored  Neurological  Neuro (WDL) X  Level of Consciousness Alert  Orientation Level Oriented X4  Cognition Appropriate at baseline;Appropriate attention/concentration;Poor judgement;Poor safety awareness;Memory impairment  Speech Appropriate at baseline;Slurred/Dysarthria  Pupil Assessment  No  Musculoskeletal  Musculoskeletal (WDL) X  Assistive Device  None  Generalized Weakness Yes  Weight Bearing Restrictions No  Musculoskeletal Details  RUE Limited movement;Ortho/Supportive Device  RUE Ortho/Supportive Device Brace (Comment)  LUE Full movement  RLE Full movement  LLE Full movement  Integumentary  Integumentary (WDL) X  Skin Color Appropriate for ethnicity  Skin Condition Dry;Flaky  Skin Integrity Abrasion;Ecchymosis;MASD  Abrasion Location Face;Heel;Other (Comment) (cheek and above eye; generalized)  Abrasion Location Orientation Left  Abrasion Intervention Other (Comment) (OTA and assessed)  Description of Blister Blood  Blister Location Heel  Blister Location Orientation Right  Blister Intervention Foam  Cracking Location Lip  Cracking Location Orientation Upper;Lower  Cracking Intervention Other (Comment) (assessed)  Moisture Associated Skin Damage Location Hip;Breast  Moisture Associated Skin Damage Orientation Right;Left  Moisture Associated Skin Damage Intervention Other (Comment) (antifungal powder)  Ecchymosis Location Leg (right leg and generalized)  Ecchymosis Location Orientation Right  Ecchymosis Intervention Other (Comment) (assessed and OTA)  Skin Turgor Non-tenting

## 2016-12-12 NOTE — Evaluation (Signed)
Occupational Therapy Evaluation and Discharge Patient Details Name: Sandra Rice MRN: [id number] DOB: 1956-05-20 Today's Date: 12/12/2016    History of Present Illness 61 year old female with past medical history of CVA, hypertension, COPD. She presented to her ED after a fall at her assisted living facility.  During hospitilization patient with fall in room.   Clinical Impression    Pt is assisted for ADL at baseline. Likely functioning near her baseline. No acute OT needs.  Follow Up Recommendations  SNF;Supervision/Assistance - 24 hour    Equipment Recommendations       Recommendations for Other Services       Precautions / Restrictions Precautions Precautions: Fall Required Braces or Orthoses: Other Brace/Splint (R wrist cock up) Restrictions Weight Bearing Restrictions: No      Mobility Bed Mobility Overal bed mobility: Needs Assistance Bed Mobility: Supine to Sit;Sit to Supine     Supine to sit: Supervision Sit to supine: Supervision   General bed mobility comments: no physical assist required, increased time to perform. Cues for safety  Transfers Overall transfer level: Needs assistance Equipment used: Rolling walker (2 wheeled) Transfers: Sit to/from [employer] Sit to Stand: Min assist Stand pivot transfers: Min assist       General transfer comment: min assist to power up from low surface with manual stabilization of RW. VCs for hand placement and positioning. Min guard for pivotal transfer to [facility].    Balance Overall balance assessment: Needs assistance;History of Falls Sitting-balance support: Feet supported Sitting balance-Leahy Scale: Fair Sitting balance - Comments: able to sit EOB without assist     Standing balance-Leahy Scale: Poor Standing balance comment: reliance on BIlateral UE support                           ADL either performed or assessed with clinical judgement   ADL Overall ADL's : Needs  assistance/impaired Eating/Feeding: Set up;Sitting   Grooming: Wash/dry hands;Sitting;Set up                   Toilet Transfer: Minimal assistance;Stand-pivot;+2 for safety/equipment;BSC;RW   Toileting- Clothing Manipulation and Hygiene: Minimal assistance;Sit to/from stand;+2 for safety/equipment       Functional mobility during ADLs:  (deferred ambulation, pt with impending knee xray) General ADL Comments: did not formally assess much of ADL as pt reports dependence     Vision Patient Visual Report: No change from baseline       Perception     Praxis      Pertinent Vitals/Pain Pain Assessment: Faces Faces Pain Scale: Hurts even more Pain Location: right wrist, left knee Pain Descriptors / Indicators: Grimacing;Guarding;Sore;Tender Pain Intervention(s): Monitored during session;Repositioned;Ice applied     Hand Dominance Right   Extremity/Trunk Assessment Upper Extremity Assessment Upper Extremity Assessment: Generalized weakness RUE Deficits / Details: wrist cock up splint in place from previous injury from a fall RUE: Unable to fully assess due to immobilization RUE Coordination: decreased fine motor;decreased gross motor   Lower Extremity Assessment Lower Extremity Assessment: Defer to PT evaluation LLE Deficits / Details: noted increased edema and bruising over anterior knee, tender to palpation, warm to touch LLE: Unable to fully assess due to pain LLE Coordination: decreased fine motor   Cervical / Trunk Assessment Cervical / Trunk Assessment: Kyphotic   Communication Communication Communication: HOH   Cognition Arousal/Alertness: Awake/alert Behavior During Therapy: Impulsive Overall Cognitive Status: No family/caregiver present to determine baseline cognitive functioning Area of Impairment: Attention;Following commands;Safety/judgement;Awareness;Problem  solving;Memory                   Current Attention Level: Sustained Memory:  Decreased short-term memory Following Commands: Follows one step commands consistently;Follows multi-step commands consistently Safety/Judgement: Decreased awareness of safety;Decreased awareness of deficits Awareness: Emergent Problem Solving: Requires verbal cues;Requires tactile cues     General Comments       Exercises     Shoulder Instructions      Home Living Family/patient expects to be discharged to:: Assisted living                             Home Equipment: Walker - 2 wheels   Additional Comments: originally at assisted living at [facility]      Prior Functioning/Environment Level of Independence: Needs assistance  Gait / Transfers Assistance Needed: walked with RW ADL's / Homemaking Assistance Needed: assisted for ADL and IADL   Comments: pt with many falls in her history, does not plan to return to [place]. [place]        OT Problem List: Decreased strength;Decreased activity tolerance;Impaired balance (sitting and/or standing);Decreased cognition;Decreased safety awareness;Decreased knowledge of use of DME or AE;Impaired UE functional use;Pain      OT Treatment/Interventions:      OT Goals(Current goals can be found in the care plan section) Acute Rehab OT Goals Patient Stated Goal: to not fall OT Goal Formulation: With patient  OT Frequency:     Barriers to D/C:            Co-evaluation PT/OT/SLP Co-Evaluation/Treatment: Yes Reason for Co-Treatment: For patient/therapist safety (recent fall prior to therapy team arrival) PT goals addressed during session: Mobility/safety with mobility OT goals addressed during session: ADL's and self-care      AM-PAC PT "6 Clicks" Daily Activity     Outcome Measure Help from another person eating meals?: A Little Help from another person taking care of personal grooming?: A Little Help from another person toileting, which includes using toliet, bedpan, or urinal?: A Little Help from another person bathing  (including washing, rinsing, drying)?: A Lot Help from another person to put on and taking off regular upper body clothing?: A Lot Help from another person to put on and taking off regular lower body clothing?: A Lot 6 Click Score: 15   End of Session Equipment Utilized During Treatment: Gait belt;Rolling walker  Activity Tolerance: Patient limited by pain Patient left: in bed;with call bell/phone within reach;with bed alarm set  OT Visit Diagnosis: Unsteadiness on feet (R26.81);Pain;Other symptoms and signs involving cognitive function;Muscle weakness (generalized) (M62.81);History of falling (Z91.81);Repeated falls (R29.6) Pain - Right/Left: Left Pain - part of body: Knee                Time: 1422-1446 OT Time Calculation (min): 24 min Charges:  OT General Charges $OT Visit: 1 Procedure OT Evaluation $OT Eval Moderate Complexity: 1 Procedure G-Codes: OT G-codes **NOT FOR INPATIENT CLASS** Functional Limitation: Self care Self Care Current Status (B4496): At least 60 percent but less than 80 percent impaired, limited or restricted Self Care Goal Status (P5916): At least 60 percent but less than 80 percent impaired, limited or restricted Self Care Discharge Status [phone]): At least 60 percent but less than 80 percent impaired, limited or restricted    Evern Bio 12/12/2016, 3:58 PM  [phone]

## 2016-12-12 NOTE — Discharge Summary (Signed)
Name: Sandra Rice MRN: [id number] DOB: 1955-12-26 61 y.o. PCP: Abelina Bachelor, MD  Date of Admission: 12/11/2016  8:09 AM Date of Discharge: 12/12/2016 Attending Physician: Gust Rung, DO  Discharge Diagnosis: 1. Fall  Discharge Medications: Allergies as of 12/12/2016   No Known Allergies     Medication List    STOP taking these medications   amLODipine 5 MG tablet Commonly known as:  NORVASC   lisinopril 5 MG tablet Commonly known as:  PRINIVIL,ZESTRIL   predniSONE 2.5 MG tablet Commonly known as:  DELTASONE     TAKE these medications   acetaminophen 325 MG tablet Commonly known as:  TYLENOL Take 650 mg by mouth every 8 (eight) hours as needed. Pain/fever   albuterol (2.5 MG/3ML) 0.083% nebulizer solution Commonly known as:  PROVENTIL Take 2.5 mg by nebulization every 4 (four) hours as needed for wheezing or shortness of breath.   aspirin 81 MG chewable tablet Chew 81 mg by mouth daily.   atorvastatin 80 MG tablet Commonly known as:  LIPITOR Take 80 mg by mouth at bedtime.   budesonide-formoterol 160-4.5 MCG/ACT inhaler Commonly known as:  SYMBICORT Inhale 2 puffs into the lungs 2 (two) times daily.   Calcium-Vitamin D 600-400 MG-UNIT Tabs Take 1 tablet by mouth 2 (two) times daily.   citalopram 10 MG tablet Commonly known as:  CELEXA Take 10 mg by mouth daily.   HYDROcodone-acetaminophen 5-325 MG tablet Commonly known as:  NORCO/VICODIN Take 1 tablet by mouth 2 (two) times daily. pain   metoprolol tartrate 100 MG tablet Commonly known as:  LOPRESSOR Take 100 mg by mouth 2 (two) times daily.   multivitamin with minerals tablet Take 1 tablet by mouth daily.   nicotine 14 mg/24hr patch Commonly known as:  NICODERM CQ - dosed in mg/24 hours Place 14 mg onto the skin daily.   nitroGLYCERIN 0.4 MG SL tablet Commonly known as:  NITROSTAT Place 0.4 mg under the tongue every 5 (five) minutes as needed for chest pain.     oxyCODONE-acetaminophen 5-325 MG tablet Commonly known as:  PERCOCET/ROXICET Take 1-2 tablets by mouth every 6 (six) hours as needed for moderate pain or severe pain.   phenytoin 100 MG ER capsule Commonly known as:  DILANTIN Take 100-200 mg by mouth See admin instructions. Take 100 mg every morning  Take 200 mg at bedtime   potassium chloride 10 MEQ CR tablet Commonly known as:  KLOR-CON Take 10 mEq by mouth daily.   ticagrelor 90 MG Tabs tablet Commonly known as:  BRILINTA Take 90 mg by mouth 2 (two) times daily.       Disposition and follow-up:   Ms.Sandra Rice was discharged from [facility] in Stable condition.  At the hospital follow up visit please address:  1. Hypertension her medications amlodipine 5 daily  and lisinopril were held and she maintained good blood pressure control. Given her admission for dizziness these were held at discharge. Please follow-up with blood pressure checks and restart these medications as needed. Metoprolol was continued as she was tachycardic and this worked to control that.  Fall- please continue aggressive PT and OT.   Prednisone prescription- based on chart review, this seemed to be a one-time 10 day prescription which was discontinued indefinitely. We stopped this prescription this admission. Please address at follow-up.  2.  Labs / imaging needed at time of follow-up: Follow-up checks x-ray in 6 weeks- 3 months to document resolution of atelectasis, EKG ( follow  T wave inversions which were new this admission)   3.  Pending labs/ test needing follow-up: HIV ab  Follow-up Appointments: Contact information for after-discharge care    Destination    [facility] Follow up.   Specialty:  Skilled Nursing Facility Contact information: [address] [place] [address] [phone]              Hospital Course by problem list:    Fall Ms. 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 Warning prior to admission she was found tangled up in her sheets beside her bed. She says that she does not remember much from that morning but does believe that she woke up and felt dizzy and could have gotten tangled up in her sheets and she tried to step out of bed. She denied chest pain, difficulty breathing, fevers, chills, syncope or changes in vision or weakness. She was found by staff during rounds and the found that she was at her baseline mental status and had no complaints of pain. She was transported to the ED via EMS. When she arrived in the ED she had heart rates 90 to 110s was afebrile. Labs revealed a leukocytosis 17 neutrophils predominance, point-of-care troponin was 0.04, lactic acid 1.26 and urinalysis without nitrites leukocytes or bacteria. EKG showed diffuse T-wave inversions which were new from prior June 7. Chest x-ray showed a new right pleural effusion and a rib fracture from a fall within the last two months. Bilateral knee x-rays were negative for fracture. CT head and neck showed no acute fracture or intracranial abnormalities. Blood cultures were drawn and she returned CT dose of levofloxacin. Her daughter reported that she had had functional decline since being transported to assisted living from previously being at a long-term care facility. Her daughter asked for her mother to be transferred to [facility] long-term care where she had been in the past and where her daughter works.     Hypertension Home medications amlodipine and lisinopril were held and she remained normotensive during this admission so they were not continued at discharge. This will need to be reevaluated.  Atelectasis This was found on chest x-ray at admission consistently found on the same side as a rib fracture suffered from prior fall. She did not have  symptoms his cough or fever related to this. Will need follow-up at some point within 3 months of discharge to document resolution.     Abnormal EKG EKG on admission showed new T-wave inversions she denied chest pain at troponin was normal.    Thoracic aortic atherosclerosis (HCC) This was found incidentally on CT scan will need outpatient monitoring.  Discharge Vitals:   BP 101/61 (BP Location: Right Arm)   Pulse 72   Temp 98.5 F (36.9 C) (Oral)   Resp 18   Ht 5\' 2"  (1.575 m)   Wt 157 lb 1.6 oz (71.3 kg)   SpO2 100%   BMI 28.73 kg/m   Pertinent Labs, Studies, and Procedures:  CT head and cervical spine 6/26 IMPRESSION: Head CT: No acute or traumatic finding. Extensive old ischemic changes as outlined above.  Cervical spine CT: No change. Degenerative spondylosis. No traumatic finding.  Discharge Instructions: Discharge Instructions    Call MD for:  persistant dizziness or light-headedness    Complete by:  As directed    Call MD for:  persistant nausea and vomiting    Complete by:  As directed    Call MD for:  severe uncontrolled pain    Complete by:  As directed    Diet - low sodium heart healthy    Complete by:  As directed    Discharge instructions    Complete by:  As directed    It has been a pleasure working with you and we are glad you're feeling better.   For your dizziness,  Stop taking lisinopril and amlodipine if you are found to have High blood pressure these may need to be continued  Follow up with your primary care provider in 1-2 weeks  If your symptoms worsen or you develop new symptoms, please seek medical help whether it is your primary care provider or emergency department.  If you have any questions about this hospitalization please call [phone].   Increase activity slowly    Complete by:  As directed       Signed: Eulah Pont, MD 12/12/2016, 1:47 PM   Pager: [phone]

## 2016-12-14 ENCOUNTER — Emergency Department (HOSPITAL_COMMUNITY): Payer: Medicare Other

## 2016-12-14 ENCOUNTER — Inpatient Hospital Stay (HOSPITAL_COMMUNITY)
Admission: EM | Admit: 2016-12-14 | Discharge: 2016-12-17 | DRG: 470 | Disposition: A | Discharge status: Skilled Nursing Facility | Payer: Medicare Other | Attending: Internal Medicine | Admitting: Internal Medicine

## 2016-12-14 ENCOUNTER — Encounter (HOSPITAL_COMMUNITY): Payer: Self-pay | Admitting: *Deleted

## 2016-12-14 DIAGNOSIS — E785 Hyperlipidemia, unspecified: Secondary | ICD-10-CM | POA: Diagnosis present

## 2016-12-14 DIAGNOSIS — I639 Cerebral infarction, unspecified: Secondary | ICD-10-CM

## 2016-12-14 DIAGNOSIS — R296 Repeated falls: Secondary | ICD-10-CM | POA: Diagnosis present

## 2016-12-14 DIAGNOSIS — I700 Atherosclerosis of aorta: Secondary | ICD-10-CM | POA: Diagnosis present

## 2016-12-14 DIAGNOSIS — D62 Acute posthemorrhagic anemia: Secondary | ICD-10-CM | POA: Diagnosis not present

## 2016-12-14 DIAGNOSIS — Z7951 Long term (current) use of inhaled steroids: Secondary | ICD-10-CM | POA: Diagnosis not present

## 2016-12-14 DIAGNOSIS — Z7982 Long term (current) use of aspirin: Secondary | ICD-10-CM | POA: Diagnosis not present

## 2016-12-14 DIAGNOSIS — L89612 Pressure ulcer of right heel, stage 2: Secondary | ICD-10-CM | POA: Diagnosis present

## 2016-12-14 DIAGNOSIS — I252 Old myocardial infarction: Secondary | ICD-10-CM | POA: Diagnosis not present

## 2016-12-14 DIAGNOSIS — Z7902 Long term (current) use of antithrombotics/antiplatelets: Secondary | ICD-10-CM | POA: Diagnosis not present

## 2016-12-14 DIAGNOSIS — L8990 Pressure ulcer of unspecified site, unspecified stage: Secondary | ICD-10-CM | POA: Insufficient documentation

## 2016-12-14 DIAGNOSIS — Z96641 Presence of right artificial hip joint: Secondary | ICD-10-CM | POA: Diagnosis present

## 2016-12-14 DIAGNOSIS — J449 Chronic obstructive pulmonary disease, unspecified: Secondary | ICD-10-CM | POA: Diagnosis present

## 2016-12-14 DIAGNOSIS — Z419 Encounter for procedure for purposes other than remedying health state, unspecified: Secondary | ICD-10-CM

## 2016-12-14 DIAGNOSIS — Z79899 Other long term (current) drug therapy: Secondary | ICD-10-CM | POA: Diagnosis not present

## 2016-12-14 DIAGNOSIS — I69319 Unspecified symptoms and signs involving cognitive functions following cerebral infarction: Secondary | ICD-10-CM | POA: Diagnosis not present

## 2016-12-14 DIAGNOSIS — I701 Atherosclerosis of renal artery: Secondary | ICD-10-CM | POA: Diagnosis present

## 2016-12-14 DIAGNOSIS — F17210 Nicotine dependence, cigarettes, uncomplicated: Secondary | ICD-10-CM | POA: Diagnosis present

## 2016-12-14 DIAGNOSIS — S72002A Fracture of unspecified part of neck of left femur, initial encounter for closed fracture: Secondary | ICD-10-CM | POA: Diagnosis present

## 2016-12-14 DIAGNOSIS — R4189 Other symptoms and signs involving cognitive functions and awareness: Secondary | ICD-10-CM | POA: Diagnosis present

## 2016-12-14 DIAGNOSIS — I739 Peripheral vascular disease, unspecified: Secondary | ICD-10-CM | POA: Diagnosis present

## 2016-12-14 DIAGNOSIS — I10 Essential (primary) hypertension: Secondary | ICD-10-CM | POA: Diagnosis present

## 2016-12-14 DIAGNOSIS — I69322 Dysarthria following cerebral infarction: Secondary | ICD-10-CM

## 2016-12-14 DIAGNOSIS — S40022A Contusion of left upper arm, initial encounter: Secondary | ICD-10-CM | POA: Diagnosis not present

## 2016-12-14 DIAGNOSIS — S72012G Unspecified intracapsular fracture of left femur, subsequent encounter for closed fracture with delayed healing: Secondary | ICD-10-CM

## 2016-12-14 DIAGNOSIS — Z955 Presence of coronary angioplasty implant and graft: Secondary | ICD-10-CM

## 2016-12-14 DIAGNOSIS — K219 Gastro-esophageal reflux disease without esophagitis: Secondary | ICD-10-CM | POA: Diagnosis present

## 2016-12-14 DIAGNOSIS — M80852A Other osteoporosis with current pathological fracture, left femur, initial encounter for fracture: Secondary | ICD-10-CM | POA: Diagnosis not present

## 2016-12-14 DIAGNOSIS — W19XXXA Unspecified fall, initial encounter: Secondary | ICD-10-CM | POA: Diagnosis not present

## 2016-12-14 DIAGNOSIS — I2510 Atherosclerotic heart disease of native coronary artery without angina pectoris: Secondary | ICD-10-CM | POA: Diagnosis present

## 2016-12-14 DIAGNOSIS — S72012A Unspecified intracapsular fracture of left femur, initial encounter for closed fracture: Principal | ICD-10-CM

## 2016-12-14 DIAGNOSIS — W010XXA Fall on same level from slipping, tripping and stumbling without subsequent striking against object, initial encounter: Secondary | ICD-10-CM | POA: Diagnosis present

## 2016-12-14 DIAGNOSIS — Z66 Do not resuscitate: Secondary | ICD-10-CM | POA: Diagnosis present

## 2016-12-14 DIAGNOSIS — Z87310 Personal history of (healed) osteoporosis fracture: Secondary | ICD-10-CM | POA: Diagnosis not present

## 2016-12-14 DIAGNOSIS — Z96649 Presence of unspecified artificial hip joint: Secondary | ICD-10-CM

## 2016-12-14 DIAGNOSIS — Z87891 Personal history of nicotine dependence: Secondary | ICD-10-CM | POA: Insufficient documentation

## 2016-12-14 DIAGNOSIS — S40012A Contusion of left shoulder, initial encounter: Secondary | ICD-10-CM | POA: Diagnosis not present

## 2016-12-14 DIAGNOSIS — Z9181 History of falling: Secondary | ICD-10-CM | POA: Diagnosis not present

## 2016-12-14 LAB — CBC WITH DIFFERENTIAL/PLATELET
Basophils Absolute: 0 10*3/uL (ref 0.0–0.1)
Basophils Relative: 0 %
EOS PCT: 3 %
Eosinophils Absolute: 0.2 10*3/uL (ref 0.0–0.7)
HCT: 34.1 % — ABNORMAL LOW (ref 36.0–46.0)
Hemoglobin: 11 g/dL — ABNORMAL LOW (ref 12.0–15.0)
LYMPHS ABS: 1.5 10*3/uL (ref 0.7–4.0)
LYMPHS PCT: 17 %
MCH: 28.6 pg (ref 26.0–34.0)
MCHC: 32.3 g/dL (ref 30.0–36.0)
MCV: 88.8 fL (ref 78.0–100.0)
MONO ABS: 0.6 10*3/uL (ref 0.1–1.0)
MONOS PCT: 7 %
Neutro Abs: 6.7 10*3/uL (ref 1.7–7.7)
Neutrophils Relative %: 73 %
PLATELETS: 253 10*3/uL (ref 150–400)
RBC: 3.84 MIL/uL — ABNORMAL LOW (ref 3.87–5.11)
RDW: 15 % (ref 11.5–15.5)
WBC: 9.1 10*3/uL (ref 4.0–10.5)

## 2016-12-14 LAB — BASIC METABOLIC PANEL
Anion gap: 12 (ref 5–15)
BUN: 8 mg/dL (ref 6–20)
CHLORIDE: 98 mmol/L — AB (ref 101–111)
CO2: 27 mmol/L (ref 22–32)
Calcium: 9.1 mg/dL (ref 8.9–10.3)
Creatinine, Ser: 0.79 mg/dL (ref 0.44–1.00)
GFR calc Af Amer: >60 mL/min (ref >60)
GFR calc non Af Amer: >60 mL/min (ref >60)
GLUCOSE: 121 mg/dL — AB (ref 65–99)
POTASSIUM: 3.6 mmol/L (ref 3.5–5.1)
Sodium: 137 mmol/L (ref 135–145)

## 2016-12-14 LAB — PROTIME-INR
INR: 1.12
Prothrombin Time: 14.5 seconds (ref 11.4–15.2)

## 2016-12-14 LAB — TYPE AND SCREEN
ABO/RH(D): O NEG
ANTIBODY SCREEN: NEGATIVE

## 2016-12-14 LAB — ABO/RH: ABO/RH(D): O NEG

## 2016-12-14 LAB — I-STAT TROPONIN, ED: Troponin i, poc: 0.02 ng/mL (ref 0.00–0.08)

## 2016-12-14 MED ORDER — NITROGLYCERIN 0.4 MG SL SUBL: 0.4000 mg | SUBLINGUAL_TABLET | SUBLINGUAL | Status: DC | PRN

## 2016-12-14 MED ORDER — MORPHINE SULFATE (PF) 4 MG/ML IV SOLN
4.0000 mg | Freq: Once | INTRAVENOUS | Status: AC
  Administered 2016-12-14: 4 mg via INTRAVENOUS
  Filled 2016-12-14: qty 1

## 2016-12-14 MED ORDER — ATORVASTATIN CALCIUM 40 MG PO TABS
80.0000 mg | ORAL_TABLET | Freq: Every day | ORAL | Status: DC
  Administered 2016-12-14 – 2016-12-16 (×3): 80 mg via ORAL
  Filled 2016-12-14 (×3): qty 2

## 2016-12-14 MED ORDER — PHENYTOIN SODIUM EXTENDED 100 MG PO CAPS: 100.0000 mg | ORAL_CAPSULE | ORAL | Status: DC

## 2016-12-14 MED ORDER — ASPIRIN 81 MG PO CHEW: 81.0000 mg | CHEWABLE_TABLET | Freq: Every day | ORAL | Status: DC

## 2016-12-14 MED ORDER — CALCIUM CARBONATE-VITAMIN D 500-200 MG-UNIT PO TABS
1.0000 | ORAL_TABLET | Freq: Two times a day (BID) | ORAL | Status: DC
  Administered 2016-12-14 – 2016-12-17 (×5): 1 via ORAL
  Filled 2016-12-14 (×5): qty 1

## 2016-12-14 MED ORDER — MORPHINE SULFATE (PF) 4 MG/ML IV SOLN
2.0000 mg | Freq: Once | INTRAVENOUS | Status: AC
  Administered 2016-12-14: 2 mg via INTRAVENOUS
  Filled 2016-12-14: qty 1

## 2016-12-14 MED ORDER — ONDANSETRON HCL 4 MG/2ML IJ SOLN
4.0000 mg | Freq: Once | INTRAMUSCULAR | Status: AC
Start: 2016-12-14 — End: 2016-12-14
  Administered 2016-12-14: 4 mg via INTRAVENOUS
  Filled 2016-12-14: qty 2

## 2016-12-14 MED ORDER — NICOTINE 14 MG/24HR TD PT24
14.0000 mg | MEDICATED_PATCH | Freq: Every day | TRANSDERMAL | Status: DC
  Administered 2016-12-16 – 2016-12-17 (×2): 14 mg via TRANSDERMAL
  Filled 2016-12-14 (×2): qty 1

## 2016-12-14 MED ORDER — PHENYTOIN SODIUM EXTENDED 100 MG PO CAPS
100.0000 mg | ORAL_CAPSULE | Freq: Every day | ORAL | Status: DC
  Administered 2016-12-16 – 2016-12-17 (×2): 100 mg via ORAL
  Filled 2016-12-14 (×4): qty 1

## 2016-12-14 MED ORDER — CITALOPRAM HYDROBROMIDE 20 MG PO TABS
10.0000 mg | ORAL_TABLET | Freq: Every day | ORAL | Status: DC
  Administered 2016-12-16 – 2016-12-17 (×2): 10 mg via ORAL
  Filled 2016-12-14 (×2): qty 1

## 2016-12-14 MED ORDER — HYDROCODONE-ACETAMINOPHEN 5-325 MG PO TABS
1.0000 | ORAL_TABLET | Freq: Two times a day (BID) | ORAL | Status: DC
  Administered 2016-12-14: 1 via ORAL
  Filled 2016-12-14: qty 1

## 2016-12-14 MED ORDER — ADULT MULTIVITAMIN W/MINERALS CH
1.0000 | ORAL_TABLET | Freq: Every day | ORAL | Status: DC
  Administered 2016-12-16 – 2016-12-17 (×2): 1 via ORAL
  Filled 2016-12-14 (×2): qty 1

## 2016-12-14 MED ORDER — HYDROCODONE-ACETAMINOPHEN 5-325 MG PO TABS: 1.0000 | ORAL_TABLET | Freq: Four times a day (QID) | ORAL | Status: DC | PRN

## 2016-12-14 MED ORDER — POTASSIUM CHLORIDE CRYS ER 20 MEQ PO TBCR
20.0000 meq | EXTENDED_RELEASE_TABLET | Freq: Every day | ORAL | Status: DC
  Administered 2016-12-16 – 2016-12-17 (×2): 20 meq via ORAL
  Filled 2016-12-14 (×2): qty 1

## 2016-12-14 MED ORDER — PHENYTOIN SODIUM EXTENDED 100 MG PO CAPS
200.0000 mg | ORAL_CAPSULE | Freq: Every day | ORAL | Status: DC
  Administered 2016-12-14 – 2016-12-16 (×3): 200 mg via ORAL
  Filled 2016-12-14 (×3): qty 2

## 2016-12-14 MED ORDER — MUPIROCIN 2 % EX OINT
1.0000 "application " | TOPICAL_OINTMENT | Freq: Two times a day (BID) | CUTANEOUS | Status: DC
  Administered 2016-12-14 – 2016-12-17 (×5): 1 via NASAL
  Filled 2016-12-14 (×3): qty 22

## 2016-12-14 MED ORDER — OXYCODONE-ACETAMINOPHEN 5-325 MG PO TABS: 1.0000 | ORAL_TABLET | Freq: Four times a day (QID) | ORAL | Status: DC | PRN

## 2016-12-14 MED ORDER — ENOXAPARIN SODIUM 40 MG/0.4ML ~~LOC~~ SOLN: 40.0000 mg | SUBCUTANEOUS | Status: DC

## 2016-12-14 MED ORDER — MOMETASONE FURO-FORMOTEROL FUM 200-5 MCG/ACT IN AERO
2.0000 | INHALATION_SPRAY | Freq: Two times a day (BID) | RESPIRATORY_TRACT | Status: DC
  Administered 2016-12-14 – 2016-12-17 (×5): 2 via RESPIRATORY_TRACT
  Filled 2016-12-14 (×2): qty 8.8

## 2016-12-14 MED ORDER — ALBUTEROL SULFATE (2.5 MG/3ML) 0.083% IN NEBU: 2.5000 mg | INHALATION_SOLUTION | RESPIRATORY_TRACT | Status: DC | PRN

## 2016-12-14 MED ORDER — METOPROLOL TARTRATE 100 MG PO TABS
100.0000 mg | ORAL_TABLET | Freq: Two times a day (BID) | ORAL | Status: DC
  Administered 2016-12-14 – 2016-12-17 (×5): 100 mg via ORAL
  Filled 2016-12-14 (×3): qty 1
  Filled 2016-12-14: qty 4
  Filled 2016-12-14: qty 1

## 2016-12-14 MED ORDER — CHLORHEXIDINE GLUCONATE CLOTH 2 % EX PADS
6.0000 | MEDICATED_PAD | Freq: Every day | CUTANEOUS | Status: DC
  Administered 2016-12-16 – 2016-12-17 (×2): 6 via TOPICAL

## 2016-12-14 MED ORDER — TICAGRELOR 90 MG PO TABS
90.0000 mg | ORAL_TABLET | Freq: Two times a day (BID) | ORAL | Status: DC
  Filled 2016-12-14: qty 1

## 2016-12-14 MED ORDER — MORPHINE SULFATE (PF) 4 MG/ML IV SOLN
0.5000 mg | INTRAVENOUS | Status: DC | PRN
  Administered 2016-12-15: 0.52 mg via INTRAVENOUS
  Filled 2016-12-14: qty 1

## 2016-12-14 NOTE — Progress Notes (Signed)
I will see the patient tonight for a formal consult.  She is tentatively posted for surgery for around noon for [place] sat.    Mayra Reel, MD [facility] [phone] 5:34 PM

## 2016-12-14 NOTE — ED Provider Notes (Signed)
[facility]-EMERGENCY DEPT Provider Note   CSN: [id number] Arrival date & time: 12/14/16  1317     History   Chief Complaint Chief Complaint  Patient presents with  . Hip Pain    left    HPI Sandra Rice is a 61 y.o. female.  Patient with history of stroke, MI, frequent falls with recent admission for fall -- presents with left hip and knee pain after a mechanical fall today. Patient states that she was walking with her walker when she lost her balance and fell onto her side. She denies feeling lightheaded or losing consciousness. She did not feel dizzy. Patient did not hit her head. She was sent for x-rays and has a suspected mildly impacted left femoral neck fracture. Patient denies numbness or tingling in her leg. She denies other medical complaints.      Past Medical History:  Diagnosis Date  . Coronary artery disease   . GERD (gastroesophageal reflux disease)   . Hypertension   . Myocardial infarction (HCC)   . Renal artery stenosis (HCC)   . Stroke [facility])     Patient Active Problem List   Diagnosis Date Noted  . COPD exacerbation (HCC) 12/12/2016  . Pleural effusion 12/12/2016  . Abnormal EKG 12/12/2016  . Thoracic aortic atherosclerosis (HCC) 12/12/2016  . COPD (chronic obstructive pulmonary disease) (HCC) 12/12/2016  . Fall 12/11/2016  . H/O: stroke 12/11/2016  . Hypertension 12/11/2016  . Hyperlipidemia 07/28/2008    History reviewed. No pertinent surgical history.  OB History    No data available       Home Medications    Prior to Admission medications   Medication Sig Start Date End Date Taking? Authorizing Provider  acetaminophen (TYLENOL) 325 MG tablet Take 650 mg by mouth every 8 (eight) hours as needed. Pain/fever     [provider]  albuterol (PROVENTIL) (2.5 MG/3ML) 0.083% nebulizer solution Take 2.5 mg by nebulization every 4 (four) hours as needed for wheezing or shortness of breath.    [provider]  aspirin 81 MG  chewable tablet Chew 81 mg by mouth daily.    [provider]  atorvastatin (LIPITOR) 80 MG tablet Take 80 mg by mouth at bedtime.    [provider]  budesonide-formoterol (SYMBICORT) 160-4.5 MCG/ACT inhaler Inhale 2 puffs into the lungs 2 (two) times daily.    [provider]  Calcium Carb-Cholecalciferol (CALCIUM-VITAMIN D) 600-400 MG-UNIT TABS Take 1 tablet by mouth 2 (two) times daily.    [provider]  citalopram (CELEXA) 10 MG tablet Take 10 mg by mouth daily.    [provider]  HYDROcodone-acetaminophen (NORCO) 5-325 MG per tablet Take 1 tablet by mouth 2 (two) times daily. pain    [provider]  metoprolol tartrate (LOPRESSOR) 100 MG tablet Take 100 mg by mouth 2 (two) times daily.    [provider]  Multiple Vitamins-Minerals (MULTIVITAMIN WITH MINERALS) tablet Take 1 tablet by mouth daily.      [provider]  nicotine (NICODERM CQ - DOSED IN MG/24 HOURS) 14 mg/24hr patch Place 14 mg onto the skin daily.    [provider]  nitroGLYCERIN (NITROSTAT) 0.4 MG SL tablet Place 0.4 mg under the tongue every 5 (five) minutes as needed for chest pain.    [provider]  oxyCODONE-acetaminophen (PERCOCET/ROXICET) 5-325 MG tablet Take 1-2 tablets by mouth every 6 (six) hours as needed for moderate pain or severe pain. 11/23/16   Shaune Pollack, MD  phenytoin (DILANTIN)  100 MG ER capsule Take 100-200 mg by mouth See admin instructions. Take 100 mg every morning  Take 200 mg at bedtime    [provider]  potassium chloride (KLOR-CON) 10 MEQ CR tablet Take 10 mEq by mouth daily.      [provider]  ticagrelor (BRILINTA) 90 MG TABS tablet Take 90 mg by mouth 2 (two) times daily.    [provider]    Family History No family history on file.  Social History Social History  Substance Use Topics  . Smoking status: Current Every Day Smoker    Packs/day: 0.50  . Smokeless  tobacco: Not on file  . Alcohol use No     Allergies   Patient has no known allergies.   Review of Systems Review of Systems  Constitutional: Negative for fever.  HENT: Negative for rhinorrhea and sore throat.   Eyes: Negative for redness.  Respiratory: Negative for cough.   Cardiovascular: Negative for chest pain.  Gastrointestinal: Negative for abdominal pain, diarrhea, nausea and vomiting.  Genitourinary: Negative for dysuria.  Musculoskeletal: Positive for arthralgias, gait problem and myalgias.  Skin: Negative for rash.  Neurological: Negative for headaches.     Physical Exam Updated Vital Signs BP 125/78 (BP Location: Left Arm)   Temp 98.1 F (36.7 C) (Oral)   Resp (!) 21   SpO2 95%   Physical Exam  Constitutional: She appears well-developed and well-nourished.  HENT:  Head: Normocephalic and atraumatic.  Mouth/Throat: Oropharynx is clear and moist.  Eyes: Conjunctivae are normal. Pupils are equal, round, and reactive to light. Right eye exhibits no discharge. Left eye exhibits no discharge.  Neck: Normal range of motion. Neck supple.  Cardiovascular: Normal rate, regular rhythm, normal heart sounds and normal pulses.  Exam reveals no decreased pulses.   Pulmonary/Chest: Effort normal and breath sounds normal.  Abdominal: Soft. There is no tenderness.  Musculoskeletal: She exhibits tenderness. She exhibits no edema.       Left hip: She exhibits decreased range of motion, tenderness and bony tenderness.       Left knee: She exhibits decreased range of motion and ecchymosis (From previous fall). Tenderness found.       Left ankle: No tenderness.       Left lower leg: She exhibits no tenderness.       Left foot: Normal.  Distal sensation and motor intact. 1+ DP pulse. Foot appears to be well perfused with normal temperature and color, normal capillary refill.  Neurological: She is alert. No sensory deficit.  Motor, sensation, and vascular distal to the injury is  fully intact.   Skin: Skin is warm and dry.  Psychiatric: She has a normal mood and affect.  Nursing note and vitals reviewed.    ED Treatments / Results  Labs (all labs ordered are listed, but only abnormal results are displayed) Labs Reviewed  BASIC METABOLIC PANEL - Abnormal; Notable for the following:       Result Value   Chloride 98 (*)    Glucose, Bld 121 (*)    All other components within normal limits  CBC WITH DIFFERENTIAL/PLATELET - Abnormal; Notable for the following:    RBC 3.84 (*)    Hemoglobin 11.0 (*)    HCT 34.1 (*)    All other components within normal limits  PROTIME-INR  I-STAT TROPOININ, ED  TYPE AND SCREEN  ABO/RH    ED ECG REPORT   Date: 12/14/2016  Rate: 85  Rhythm: normal sinus rhythm  QRS Axis: normal  Intervals: normal  ST/T Wave abnormalities: nonspecific T wave changes  Conduction Disutrbances:none  Narrative Interpretation:   Old EKG Reviewed: changes noted, Resolution of ST/T wave abnormalities laterally  I have personally reviewed the EKG tracing and disagree with the computerized printout as noted.  Radiology Dg Knee 1-2 Views Left  Result Date: 12/12/2016 CLINICAL DATA:  Anterior knee pain and bruising after falling today. Initial encounter. EXAM: LEFT KNEE - 1-2 VIEW COMPARISON:  Radiographs 12/11/2016. FINDINGS: The mineralization and alignment are normal. There is no evidence of acute fracture or dislocation. The joint spaces are preserved. Stable small knee joint effusion. There is increased anterior soft tissue swelling peripheral to the patellar tendon and patella. Femoral popliteal atherosclerosis noted. IMPRESSION: Interval increased anterior soft tissue swelling. No acute osseous findings or change in small joint effusion. Electronically Signed   By: Carey Bullocks M.D.   On: 12/12/2016 15:27    Procedures Procedures (including critical care time)  Medications Ordered in ED Medications  multivitamin with minerals tablet 1  tablet (not administered)  HYDROcodone-acetaminophen (NORCO/VICODIN) 5-325 MG per tablet 1 tablet (not administered)  potassium chloride SA (K-DUR,KLOR-CON) CR tablet 20 mEq (not administered)  oxyCODONE-acetaminophen (PERCOCET/ROXICET) 5-325 MG per tablet 1-2 tablet (not administered)  aspirin chewable tablet 81 mg (not administered)  atorvastatin (LIPITOR) tablet 80 mg (not administered)  ticagrelor (BRILINTA) tablet 90 mg (not administered)  Calcium-Vitamin D 600-400 MG-UNIT TABS 1 tablet (not administered)  citalopram (CELEXA) tablet 10 mg (not administered)  phenytoin (DILANTIN) ER capsule 100-200 mg (not administered)  metoprolol tartrate (LOPRESSOR) tablet 100 mg (not administered)  nicotine (NICODERM CQ - dosed in mg/24 hours) patch 14 mg (not administered)  mometasone-formoterol (DULERA) 200-5 MCG/ACT inhaler 2 puff (not administered)  albuterol (PROVENTIL) (2.5 MG/3ML) 0.083% nebulizer solution 2.5 mg (not administered)  nitroGLYCERIN (NITROSTAT) SL tablet 0.4 mg (not administered)  morphine 4 MG/ML injection 4 mg (4 mg Intravenous Given 12/14/16 1539)  ondansetron (ZOFRAN) injection 4 mg (4 mg Intravenous Given 12/14/16 1539)     Initial Impression / Assessment and Plan / ED Course  I have reviewed the triage vital signs and the nursing notes.  Pertinent labs & imaging results that were available during my care of the patient were reviewed by me and considered in my medical decision making (see chart for details).     Patient seen and examined. Work-up initiated. Medications ordered.   Vital signs reviewed and are as follows: BP 125/78 (BP Location: Left Arm)   Temp 98.1 F (36.7 C) (Oral)   Resp (!) 21   SpO2 95%   4:51 PM Patient and daughter informed.   Dr. Roda Shutters requested pt be admitted at [facility], however there are no beds.   Current plan to be admitted here at [facility] and surgery in AM.   Spoke with Dr. Mahala Menghini who will admit.   Final Clinical Impressions(s) / ED  Diagnoses   Final diagnoses:  Left displaced femoral neck fracture (HCC)   Admit.   New Prescriptions New Prescriptions   No medications on file     Renne Crigler, Cordelia Poche 12/14/16 1654    Derwood Kaplan, MD 12/14/16 [phone]

## 2016-12-14 NOTE — ED Notes (Signed)
Vitals from 17:30 were a result of pt being taken off Oxygen by MD.  Pt put back on 2L [place] and O2 saturation has risen from 87% to 94%.

## 2016-12-14 NOTE — ED Notes (Signed)
Bed: LP37 Expected date:  Expected time:  Means of arrival:  Comments: EMS- 61yo F, fall yesterday/hip pain/possible fracture

## 2016-12-14 NOTE — Clinical Social Work Note (Signed)
Clinical Social Work Assessment  Patient Details  Name: Sandra Rice MRN: [id number] Date of Birth: 01/22/1956  Date of referral:  12/14/16               Reason for consult:                   Permission sought to share information with:  Facility Art therapist granted to share information::  Yes, Verbal Permission Granted  Name::        Agency::     Relationship::     Contact Information:     Housing/Transportation Living arrangements for the past 2 months:  [place] of Information:  Patient Patient Interpreter Needed:  None Criminal Activity/Legal Involvement Pertinent to Current Situation/Hospitalization:    Significant Relationships:  Adult Children Lives with:  Facility Resident Do you feel safe going back to the place where you live?  Yes Need for family participation in patient care:  No (Coment)  Care giving concerns:  None listed by pt  Social Worker assessment / plan:  CSW met with pt and confirmed pt's plan to be discharged back to [facility] to live at discharge.  CSW provided active listening and validated pt's concerns.   CSW Dept was give permission to complete FL-2 and send referrals out to [facility] facility via the hub per pt's request.  Pt has been living at [facility] prior to being admitted to [facility].  Employment status:  Retired Forensic scientist:  Information systems manager, Medicaid [place] PT Recommendations:  Not assessed at this time Information / Referral to community resources:     Patient/Family's Response to care:  *Patient alert and oriented.  Patient agreeable to plan.  Pt's daughter supportive and strongly involved in pt.'s care.  Pt pleasant and appreciated CSW intervention.    Patient/Family's Understanding of and Emotional Response to Diagnosis, Current Treatment, and Prognosis: Still assessing  Emotional Assessment Appearance:    Attitude/Demeanor/Rapport:    Affect  (typically observed):  Accepting, Adaptable, Calm, Pleasant Orientation:  Oriented to Self, Oriented to Place, Oriented to  Time, Oriented to Situation Alcohol / Substance use:    Psych involvement (Current and /or in the community):  Yes (Comment)  Discharge Needs  Concerns to be addressed:    Readmission within the last 30 days:  No Current discharge risk:  None Barriers to Discharge:  No Barriers Identified   Claudine Mouton, LCSWA 12/14/2016, 7:13 PM

## 2016-12-14 NOTE — Consult Note (Signed)
ORTHOPAEDIC CONSULTATION  REQUESTING PHYSICIAN: Nita Sells, MD  Chief Complaint: Left femoral neck hip fracture  HPI: Sandra Rice is a 61 y.o. female who presents with left hip fracture s/p mechanical fall PTA.  The patient endorses severe pain in the left hip, that does not radiate, grinding in quality, worse with any movement, better with immobilization.  Denies LOC/fever/chills/nausea/vomiting.  Walks with assistive devices (walker, cane, wheelchair).  Does not live independently.  Denies LOC, neck pain, abd pain.  Recently broke right hip in February in [place].  Past Medical History:  Diagnosis Date  . Coronary artery disease   . GERD (gastroesophageal reflux disease)   . Hypertension   . Myocardial infarction ([place])   . Renal artery stenosis ([place])   . Stroke [facility])    Past Surgical History:  Procedure Laterality Date  . JOINT REPLACEMENT Right 07/2016   right hip replacement due to a fall in [place]   Social History   Social History  . Marital status: Single    Spouse name: N/A  . Number of children: N/A  . Years of education: N/A   Social History Main Topics  . Smoking status: Former Smoker    Packs/day: 0.50    Years: 40.00    Quit date: 08/12/2016  . Smokeless tobacco: Never Used  . Alcohol use No  . Drug use: No  . Sexual activity: No   Other Topics Concern  . None   Social History Narrative   Lives with sister.  Pt was previously living at [facility]   History reviewed. No pertinent family history. No Known Allergies Prior to Admission medications   Medication Sig Start Date End Date Taking? Authorizing Provider  acetaminophen (TYLENOL) 325 MG tablet Take 650 mg by mouth every 8 (eight) hours as needed. Pain/fever    Yes [provider]  albuterol (PROVENTIL) (2.5 MG/3ML) 0.083% nebulizer solution Take 2.5 mg by nebulization every 4 (four) hours as needed for wheezing or shortness of breath.   Yes [provider]  aspirin 81 MG chewable tablet Chew 81 mg by mouth daily.   Yes [provider]  atorvastatin (LIPITOR) 80 MG tablet Take 80 mg by mouth at bedtime.   Yes [provider]  budesonide-formoterol (SYMBICORT) 160-4.5 MCG/ACT inhaler Inhale 2 puffs into the lungs 2 (two) times daily.   Yes [provider]  Calcium Carb-Cholecalciferol (CALCIUM-VITAMIN D) 600-400 MG-UNIT TABS Take 1 tablet by mouth 2 (two) times daily.   Yes [provider]  citalopram (CELEXA) 10 MG tablet Take 10 mg by mouth daily.   Yes [provider]  HYDROcodone-acetaminophen (NORCO) 5-325 MG per tablet Take 1 tablet by mouth 2 (two) times daily. pain   Yes [provider]  metoprolol tartrate (LOPRESSOR) 100 MG tablet Take 100 mg by mouth 2 (two) times daily.   Yes [provider]  Multiple Vitamins-Minerals (MULTIVITAMIN WITH MINERALS) tablet Take 1 tablet by mouth daily.     Yes [provider]  nicotine (NICODERM CQ - DOSED IN MG/24 HOURS) 14 mg/24hr patch Place 14 mg onto the skin daily.   Yes [provider]  oxyCODONE-acetaminophen (PERCOCET/ROXICET) 5-325 MG tablet Take 1-2 tablets by mouth every 6 (six) hours as needed for moderate pain or severe pain. 11/23/16  Yes Duffy Bruce, MD  phenytoin (DILANTIN) 100 MG ER capsule Take 100-200 mg by mouth See admin instructions. Take 100 mg every morning  Take 200 mg at bedtime  Yes [provider]  potassium chloride (KLOR-CON) 10 MEQ CR tablet Take 10 mEq by mouth daily.     Yes [provider]  ticagrelor (BRILINTA) 90 MG TABS tablet Take 90 mg by mouth 2 (two) times daily.   Yes [provider]  nitroGLYCERIN (NITROSTAT) 0.4 MG SL tablet Place 0.4 mg under the tongue every 5 (five) minutes as needed for chest pain.    [provider]   Dg Chest 1 View  Result Date: 12/14/2016 CLINICAL DATA:  [place] in bathroom yesterday, fell onto LEFT side, LEFT hip and  LEFT knee pain EXAM: CHEST 1 VIEW COMPARISON:  12/12/2016 FINDINGS: Enlargement of cardiac silhouette. Atherosclerotic calcification aorta. Mediastinal contours and pulmonary vascularity normal. Bibasilar atelectasis greater on RIGHT. Improved aeration at RIGHT lung base versus previous exam. Remaining lungs clear. No pleural effusion or pneumothorax. IMPRESSION: Bibasilar atelectasis greater on RIGHT, improved versus 12/12/2016. Enlargement of cardiac silhouette. Aortic Atherosclerosis (ICD10-I70.0). Electronically Signed   By: Lavonia Dana M.D.   On: 12/14/2016 15:06   Dg Knee Complete 4 Views Left  Result Date: 12/14/2016 CLINICAL DATA:  [place] in the bathroom yesterday onto LEFT side, LEFT hip and LEFT knee pain EXAM: LEFT KNEE - COMPLETE 4+ VIEW COMPARISON:  12/12/2016 FINDINGS: Diffuse osseous demineralization. Medial compartment joint space narrowing. No acute fracture, dislocation, or bone destruction. Anterior infrapatellar soft tissue swelling. Question minimal knee joint effusion. Scattered atherosclerotic calcifications. IMPRESSION: No acute osseous abnormalities. Electronically Signed   By: Lavonia Dana M.D.   On: 12/14/2016 15:11   Dg Hip Unilat With Pelvis 2-3 Views Left  Result Date: 12/14/2016 CLINICAL DATA:  [place] in bathroom yesterday, fell onto LEFT side, LEFT hip and knee pain EXAM: DG HIP (WITH OR WITHOUT PELVIS) 2-3V LEFT COMPARISON:  08/13/2016 FINDINGS: Osseous demineralization. LEFT hip joint space preserved. Mildly displaced subcapital fracture of the LEFT femoral neck. No dislocation. No additional focal bony abnormality seen. Prior RIGHT hip replacement. Scattered atherosclerotic calcifications aorta. IMPRESSION: Mildly displaced subcapital fracture of the LEFT femoral neck. Electronically Signed   By: Lavonia Dana M.D.   On: 12/14/2016 15:07    All pertinent xrays, MRI, CT independently reviewed and interpreted  Positive ROS: All other systems have been reviewed and were  otherwise negative with the exception of those mentioned in the HPI and as above.  Physical Exam: General: Alert, no acute distress Cardiovascular: No pedal edema Respiratory: No cyanosis, no use of accessory musculature GI: No organomegaly, abdomen is soft and non-tender Skin: No lesions in the area of chief complaint Neurologic: Sensation intact distally Psychiatric: Patient is competent for consent with normal mood and affect Lymphatic: No axillary or cervical lymphadenopathy  MUSCULOSKELETAL:  - pain with movement of the hip and extremity - skin intact - NVI distally - compartments soft  Assessment: Left femoral neck hip fracture  Plan: - partial hip replacement is recommended, patient and family are aware of r/b/a and wish to proceed - consent obtained - medical optimization per primary team - surgery is planned for sat at 10 am - Based on history and fracture pattern this likely represents a fragility fracture. - Fragility fractures affect up to one half of women and one third of men after age 31 years and occur in the setting of bone disorder such as osteoporosis or osteopenia and warrant appropriate work-up. - The following are general recommendations that may serve as an outline for an appropriate work-up:  1.) Obtain bone density measurement to confirm presumptive diagnosis, assess  severity of osteoporosis and risk of future fracture, and use as baseline for monitoring treatment  2.) Obtain laboratory tests: CBC, ESR, serum calcium, creatinine, albumin,phosphate, alkaline phosphatase, liver transaminases, protein electrophoresis, urinalysis, 25-hydroxyvitamin D.  3.) Exclude secondary causes of low bone mass and skeletal fragility (eg,multiple myeloma, lymphoma) as indicated.  4.) Obtain radiograph of thoracic and lumbar spine, particularly among individuals with back pain or height loss to assess presence of vertebral fractures  5.) Intermittent administration of  recombinant human parathyroid hormone  6.) Optimize nutritional status using nutritional supplementation.  7.) Patient/family education to prevent future falls.  8.) Early mobilization and exercise program - exercise decreases the rate of bone loss and has been associated with decreased rate of fragility fractures   Thank you for the consult and the opportunity to see Ms. Morea  N. Eduard Roux, MD [place] 10:41 PM

## 2016-12-14 NOTE — Progress Notes (Signed)
Spoke with Dr. Mahala Menghini. Patient to be admitted to [facility] at [facility] (12/14/16). She is tentatively scheduled for orthopedic surgery at [facility] around noon tomorrow (12/15/16). Patient was just discharged from IMTS service on 12/12/16. We will assume care post-op at [facility]  Darreld Mclean, MD Internal Medicine Resident PGY-2

## 2016-12-14 NOTE — H&P (Addendum)
Sandra Rice BCW:[id number] DOB: October 08, 1955 DOA: 12/14/2016  Referring physician: Emmit Alexanders, ED PA PCP: Abelina Bachelor, MD  Specialists: Dr. Osie Bond  Chief Complaint: Fall and hip fracture  HPI: Sandra Rice is a 61 y.o. 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 RLL infiltrate-ultimately felt to have atelectasis Felt that falls were secondary to iatrogenic hypotension/dizziness and amlodipine, lisinopril were held and blood pressure was within normal parameters  Patient was discharged back to ALF [place] [facility]  Represents to [facility] long hospital with fall at [facility]-had just been d/c from [facility] with multiple recurrent falls-primary team at the time d/c her ACE, CCB as felt to be culprit She awoke the early am after d/c to [place], walked to the RR and felt "weak" and fell and broke hip No endorsement CP or other symptoms a the time--spcifically no "wooziness", no blacking out" --she is not a clear historian  Found to have a L fem neck #   Right now she says that she has 8/10 pain in the left hip and that she is hungry. Otherwise she has no chest pain no fever no chills no nausea no vomiting no dark stool no tarry stool now for blurred vision no double vision She does have some mild dysarthria which are remnants of her stroke  Emergency room workup shows BUN/creatinine 8/0.7 Hemoglobin 11 Chest x-ray 1  view shows bilateral atelectasis Left hip x-ray shows mildly displaced subcapital fracture of the left hip Left knee x-ray does not reveal any bony abnormality   Past Medical History:  Diagnosis Date  . Coronary artery disease   . GERD (gastroesophageal reflux disease)   . Hypertension   . Myocardial infarction (HCC)   . Renal artery stenosis (HCC)   . Stroke [facility])    History reviewed. No pertinent surgical history. Social History:  reports that she has been smoking.  She has been smoking about 0.50 packs per day. She does not have any smokeless tobacco history on file. She reports that she does not drink alcohol or use drugs. 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    Prior to Admission medications   Medication Sig Start Date End Date Taking? Authorizing Provider  acetaminophen (TYLENOL) 325 MG tablet Take 650 mg by mouth every 8 (eight) hours as needed. Pain/fever     [provider]  albuterol (PROVENTIL) (2.5 MG/3ML) 0.083% nebulizer solution Take 2.5 mg by nebulization every 4 (four) hours  as needed for wheezing or shortness of breath.    [provider]  aspirin 81 MG chewable tablet Chew 81 mg by mouth daily.    [provider]  atorvastatin (LIPITOR) 80 MG tablet Take 80 mg by mouth at bedtime.    [provider]  budesonide-formoterol (SYMBICORT) 160-4.5 MCG/ACT inhaler Inhale 2 puffs into the lungs 2 (two) times daily.    [provider]  Calcium Carb-Cholecalciferol (CALCIUM-VITAMIN D) 600-400 MG-UNIT  TABS Take 1 tablet by mouth 2 (two) times daily.    [provider]  citalopram (CELEXA) 10 MG tablet Take 10 mg by mouth daily.    [provider]  HYDROcodone-acetaminophen (NORCO) 5-325 MG per tablet Take 1 tablet by mouth 2 (two) times daily. pain    [provider]  metoprolol tartrate (LOPRESSOR) 100 MG tablet Take 100 mg by mouth 2 (two) times daily.    [provider]  Multiple Vitamins-Minerals (MULTIVITAMIN WITH MINERALS) tablet Take 1 tablet by mouth daily.      [provider]  nicotine (NICODERM CQ - DOSED IN MG/24 HOURS) 14 mg/24hr patch Place 14 mg onto the skin daily.    [provider]  nitroGLYCERIN (NITROSTAT) 0.4 MG SL tablet Place 0.4 mg under the tongue every 5 (five) minutes as needed for chest pain.    [provider]  oxyCODONE-acetaminophen (PERCOCET/ROXICET) 5-325 MG tablet Take 1-2 tablets by mouth every 6 (six) hours as needed for moderate pain or severe pain. 11/23/16   Shaune Pollack, MD  phenytoin (DILANTIN) 100 MG ER capsule Take 100-200 mg by mouth See admin instructions. Take 100 mg every morning  Take 200 mg at bedtime    [provider]  potassium chloride (KLOR-CON) 10 MEQ CR tablet Take 10 mEq by mouth daily.      [provider]  ticagrelor (BRILINTA) 90 MG TABS tablet Take 90 mg by mouth 2 (two) times daily.    [provider]   Physical Exam: Vitals:   12/14/16 1415 12/14/16 1543  BP:  (!) 165/100  Pulse: 85 95  Resp: (!) 24 (!) 24  Temp:      Pleasant alert edentulous  Mallampati 2  No JVD , no bruit  S1-S2 RRR  Chest is clinically clear otherwise  Left leg is externally rotated and is very tender to passive range of motion  Straight leg raise right side does not reveal any tenderness  Right heel has decubitus ulcer on the heel -present since February according to daughter  Neurologically otherwise intact  Euthymic and congruent Skin is benign with no  rash or edema   Labs on Admission:  Basic Metabolic Panel:  Recent Labs Lab 12/11/16 0853 12/12/16 0735 12/14/16 1404  NA 136 138 137  K 4.9 3.8 3.6  CL 104 106 98*  CO2 21* 22 27  GLUCOSE 145* 113* 121*  BUN 10 9 8   CREATININE 0.89 0.71 0.79  CALCIUM 8.8* 8.6* 9.1   Liver Function Tests:  Recent Labs Lab 12/11/16 0853  AST 28  ALT 9*  ALKPHOS 130*  BILITOT 1.4*  PROT 6.1*  ALBUMIN 3.2*   No results for input(s): LIPASE, AMYLASE in the last 168 hours. No results for input(s): AMMONIA in the last 168 hours. CBC:  Recent Labs Lab 12/11/16 0853 12/12/16 0735 12/14/16 1404  WBC 15.9* 9.1 9.1  NEUTROABS 13.6*  --  6.7  HGB 12.0 10.2* 11.0*  HCT 38.7 33.5* 34.1*  MCV 93.3 92.5 88.8  PLT 253 204 253   Cardiac Enzymes:  Recent Labs Lab 12/11/16 0853  CKTOTAL 83    BNP (last 3 results) No results for input(s): BNP in the last 8760 hours.  ProBNP (last 3 results) No results for input(s): PROBNP in the last 8760 hours.  CBG: No results for input(s): GLUCAP in the last 168 hours.  Radiological Exams on Admission: Dg Chest 1 View  Result Date: 12/14/2016 CLINICAL DATA:  Larey Seat in bathroom yesterday, fell onto LEFT side, LEFT hip and LEFT knee pain EXAM: CHEST 1 VIEW COMPARISON:  12/12/2016 FINDINGS: Enlargement of cardiac silhouette. Atherosclerotic calcification aorta. Mediastinal contours and pulmonary vascularity normal. Bibasilar atelectasis greater on RIGHT. Improved aeration at RIGHT lung base versus previous exam. Remaining lungs clear. No pleural effusion or pneumothorax. IMPRESSION: Bibasilar atelectasis greater on RIGHT, improved versus 12/12/2016. Enlargement of cardiac silhouette. Aortic Atherosclerosis (ICD10-I70.0). Electronically Signed   By: Ulyses Southward M.D.   On: 12/14/2016 15:06   Dg Knee Complete 4 Views Left  Result Date: 12/14/2016 CLINICAL DATA:  Larey Seat in the bathroom yesterday onto LEFT side, LEFT hip and LEFT knee pain EXAM: LEFT KNEE -  COMPLETE 4+ VIEW COMPARISON:  12/12/2016 FINDINGS: Diffuse osseous demineralization. Medial compartment joint space narrowing. No acute fracture, dislocation, or bone destruction. Anterior infrapatellar soft tissue swelling. Question minimal knee joint effusion. Scattered atherosclerotic calcifications. IMPRESSION: No acute osseous abnormalities. Electronically Signed   By: Ulyses Southward M.D.   On: 12/14/2016 15:11   Dg Hip Unilat With Pelvis 2-3 Views Left  Result Date: 12/14/2016 CLINICAL DATA:  Larey Seat in bathroom yesterday, fell onto LEFT side, LEFT hip and knee pain EXAM: DG HIP (WITH OR WITHOUT PELVIS) 2-3V LEFT COMPARISON:  08/13/2016 FINDINGS: Osseous demineralization. LEFT hip joint space preserved. Mildly displaced subcapital fracture of the LEFT femoral neck. No dislocation. No additional focal bony abnormality seen. Prior RIGHT hip replacement. Scattered atherosclerotic calcifications aorta. IMPRESSION: Mildly displaced subcapital fracture of the LEFT femoral neck. Electronically Signed   By: Ulyses Southward M.D.   On: 12/14/2016 15:07    EKG: Independently reviewed. 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, but significantly declined at ALF"  Nothing by mouth after  midnight  Pain control morphine 2 mg every 4 when necessary  Cardiac peri- operative mortality/morbidity =0.3%  Dr. Roda Shutters wishes to perform surgery at [facility] however there are no beds there and I have confirmed this with bed control--> we will ask the teaching service to assume her care at [facility] once a bed becomes available and I have contacted.  Prior CVA x 2 [2008--subsequent]  Hold brillinta 90 mg bid for now  Have explained to the daughter the risk of possible CVA and she understands the same as patient had a hip surgery in February  Would continue aspirin  perioperatively-chest guidelines seem to indicate that this decreases mortality  CAD as prior NST MI,? Stent Hyperlipidemia  hypertension with episodic hypotension-discontinued off of amlodipine/lisinopril at recent hospital visit earlier this week  [facility]  Continue metoprolol 100 twice a day  Continue atorvastatin high intensity dosing 80 mg  Keep on telemetry and monitor for ischemic changes  Renal artery stenosis Bun/creat nl range-no signs ischemic nephropathy  Currently quiescent at this time, urine/creatinine within normal range  ? Seizure disorder  On phenytoin, indication not completely clear  Recommend decreasing polypharmacy on discharge  COPD stage B Tobacco habituation  No current wheeze, continue Dulera 2 puffs twice a day, albuterol every 4 when necessary  Continue nicotine patch--probably can d/c the same--states "quit smoking after my R hip surgery"     Orthopedics Dr. Roda Shutters wishes to perform surgery at [facility] at around 12 p.m.  I have alerted Dr. Allena Katz of IM TS who will assume care on their service once patient is transferred over to [facility]  Patient is a DNAR and I have updated patient's daughter at the bedside   Time spent: 87  Mahala Menghini [facility] Pager [phone]  If 7PM-7AM, please contact night-coverage [email or web address] Password  [facility] 12/14/2016, 4:51 PM

## 2016-12-14 NOTE — ED Triage Notes (Addendum)
Pt bib PTAR and comes from [facility].  Pt had a fall on Thursday at 1:00.  The facility took an x-ray of pt's hip and the x-ray shows pt has the left hip fracture.  Pt a/o x 4.   Pt was given a Norco at the facility at 12:38

## 2016-12-15 ENCOUNTER — Inpatient Hospital Stay (HOSPITAL_COMMUNITY): Payer: Medicare Other | Admitting: Anesthesiology

## 2016-12-15 ENCOUNTER — Encounter (HOSPITAL_COMMUNITY)
Admission: EM | Disposition: A | Discharge status: Skilled Nursing Facility | Payer: Self-pay | Source: Home / Self Care | Attending: Internal Medicine

## 2016-12-15 ENCOUNTER — Inpatient Hospital Stay (HOSPITAL_COMMUNITY): Payer: Medicare Other

## 2016-12-15 ENCOUNTER — Encounter (HOSPITAL_COMMUNITY): Payer: Self-pay | Admitting: Anesthesiology

## 2016-12-15 ENCOUNTER — Other Ambulatory Visit: Payer: Self-pay | Admitting: Orthopaedic Surgery

## 2016-12-15 DIAGNOSIS — I739 Peripheral vascular disease, unspecified: Secondary | ICD-10-CM

## 2016-12-15 DIAGNOSIS — Z79899 Other long term (current) drug therapy: Secondary | ICD-10-CM

## 2016-12-15 DIAGNOSIS — W010XXA Fall on same level from slipping, tripping and stumbling without subsequent striking against object, initial encounter: Secondary | ICD-10-CM

## 2016-12-15 DIAGNOSIS — L8990 Pressure ulcer of unspecified site, unspecified stage: Secondary | ICD-10-CM

## 2016-12-15 DIAGNOSIS — M80852A Other osteoporosis with current pathological fracture, left femur, initial encounter for fracture: Secondary | ICD-10-CM

## 2016-12-15 DIAGNOSIS — Z95828 Presence of other vascular implants and grafts: Secondary | ICD-10-CM

## 2016-12-15 DIAGNOSIS — Z87310 Personal history of (healed) osteoporosis fracture: Secondary | ICD-10-CM

## 2016-12-15 DIAGNOSIS — S40022A Contusion of left upper arm, initial encounter: Secondary | ICD-10-CM

## 2016-12-15 DIAGNOSIS — S40012A Contusion of left shoulder, initial encounter: Secondary | ICD-10-CM

## 2016-12-15 DIAGNOSIS — Z7982 Long term (current) use of aspirin: Secondary | ICD-10-CM

## 2016-12-15 DIAGNOSIS — Z955 Presence of coronary angioplasty implant and graft: Secondary | ICD-10-CM

## 2016-12-15 DIAGNOSIS — S72002A Fracture of unspecified part of neck of left femur, initial encounter for closed fracture: Secondary | ICD-10-CM

## 2016-12-15 DIAGNOSIS — I10 Essential (primary) hypertension: Secondary | ICD-10-CM

## 2016-12-15 DIAGNOSIS — I69319 Unspecified symptoms and signs involving cognitive functions following cerebral infarction: Secondary | ICD-10-CM

## 2016-12-15 DIAGNOSIS — R296 Repeated falls: Secondary | ICD-10-CM

## 2016-12-15 DIAGNOSIS — F418 Other specified anxiety disorders: Secondary | ICD-10-CM

## 2016-12-15 DIAGNOSIS — I69322 Dysarthria following cerebral infarction: Secondary | ICD-10-CM

## 2016-12-15 DIAGNOSIS — I2510 Atherosclerotic heart disease of native coronary artery without angina pectoris: Secondary | ICD-10-CM

## 2016-12-15 DIAGNOSIS — J449 Chronic obstructive pulmonary disease, unspecified: Secondary | ICD-10-CM

## 2016-12-15 DIAGNOSIS — Z7951 Long term (current) use of inhaled steroids: Secondary | ICD-10-CM

## 2016-12-15 DIAGNOSIS — Z8669 Personal history of other diseases of the nervous system and sense organs: Secondary | ICD-10-CM

## 2016-12-15 DIAGNOSIS — Z9181 History of falling: Secondary | ICD-10-CM

## 2016-12-15 HISTORY — PX: ANTERIOR APPROACH HEMI HIP ARTHROPLASTY: SHX6690

## 2016-12-15 LAB — TYPE AND SCREEN
ABO/RH(D): O NEG
Antibody Screen: NEGATIVE

## 2016-12-15 LAB — PHENYTOIN LEVEL, TOTAL: Phenytoin Lvl: 15.6 ug/mL (ref 10.0–20.0)

## 2016-12-15 LAB — ABO/RH: ABO/RH(D): O NEG

## 2016-12-15 LAB — SURGICAL PCR SCREEN
MRSA, PCR: POSITIVE — AB
STAPHYLOCOCCUS AUREUS: POSITIVE — AB

## 2016-12-15 SURGERY — HEMIARTHROPLASTY, HIP, DIRECT ANTERIOR APPROACH, FOR FRACTURE
Anesthesia: General | Laterality: Left

## 2016-12-15 MED ORDER — SODIUM CHLORIDE 0.9 % IV SOLN
1000.0000 mg | INTRAVENOUS | Status: AC
  Administered 2016-12-15: 1000 mg via INTRAVENOUS
  Filled 2016-12-15: qty 10

## 2016-12-15 MED ORDER — ASPIRIN EC 325 MG PO TBEC
325.0000 mg | DELAYED_RELEASE_TABLET | Freq: Every day | ORAL | Status: DC
  Administered 2016-12-16 – 2016-12-17 (×2): 325 mg via ORAL
  Filled 2016-12-15 (×2): qty 1

## 2016-12-15 MED ORDER — FENTANYL CITRATE (PF) 100 MCG/2ML IJ SOLN
INTRAMUSCULAR | Status: AC
  Administered 2016-12-15: 25 ug via INTRAVENOUS
  Filled 2016-12-15: qty 2

## 2016-12-15 MED ORDER — MENTHOL 3 MG MT LOZG: 1.0000 | LOZENGE | OROMUCOSAL | Status: DC | PRN

## 2016-12-15 MED ORDER — PHENYLEPHRINE HCL 10 MG/ML IJ SOLN
INTRAMUSCULAR | Status: DC | PRN
  Administered 2016-12-15 (×2): 80 ug via INTRAVENOUS

## 2016-12-15 MED ORDER — ROCURONIUM BROMIDE 100 MG/10ML IV SOLN
INTRAVENOUS | Status: DC | PRN
  Administered 2016-12-15: 40 mg via INTRAVENOUS

## 2016-12-15 MED ORDER — CEFAZOLIN SODIUM-DEXTROSE 2-4 GM/100ML-% IV SOLN
2.0000 g | INTRAVENOUS | Status: AC
  Administered 2016-12-15: 2 g via INTRAVENOUS
  Filled 2016-12-15 (×2): qty 100

## 2016-12-15 MED ORDER — METOCLOPRAMIDE HCL 5 MG PO TABS: 5.0000 mg | ORAL_TABLET | Freq: Three times a day (TID) | ORAL | Status: DC | PRN

## 2016-12-15 MED ORDER — TRANEXAMIC ACID 1000 MG/10ML IV SOLN
INTRAVENOUS | Status: AC | PRN
  Administered 2016-12-15: 2000 mg via TOPICAL

## 2016-12-15 MED ORDER — SODIUM CHLORIDE 0.9 % IV SOLN
INTRAVENOUS | Status: DC
  Administered 2016-12-15: 1 mL via INTRAVENOUS

## 2016-12-15 MED ORDER — ALUM & MAG HYDROXIDE-SIMETH 200-200-20 MG/5ML PO SUSP: 30.0000 mL | ORAL | Status: DC | PRN

## 2016-12-15 MED ORDER — OXYCODONE HCL 5 MG PO TABS
5.0000 mg | ORAL_TABLET | ORAL | Status: DC | PRN
  Administered 2016-12-17 (×2): 5 mg via ORAL
  Filled 2016-12-15 (×2): qty 1

## 2016-12-15 MED ORDER — MORPHINE SULFATE (PF) 2 MG/ML IV SOLN: 0.5000 mg | INTRAVENOUS | Status: DC | PRN

## 2016-12-15 MED ORDER — SUGAMMADEX SODIUM 200 MG/2ML IV SOLN
INTRAVENOUS | Status: AC
  Filled 2016-12-15: qty 2

## 2016-12-15 MED ORDER — METHOCARBAMOL 500 MG PO TABS
500.0000 mg | ORAL_TABLET | Freq: Four times a day (QID) | ORAL | Status: DC | PRN
  Administered 2016-12-15 – 2016-12-17 (×3): 500 mg via ORAL
  Filled 2016-12-15 (×3): qty 1

## 2016-12-15 MED ORDER — TRANEXAMIC ACID 1000 MG/10ML IV SOLN: 1000.0000 mg | INTRAVENOUS | Status: DC

## 2016-12-15 MED ORDER — PHENOL 1.4 % MT LIQD: 1.0000 | OROMUCOSAL | Status: DC | PRN

## 2016-12-15 MED ORDER — LACTATED RINGERS IV SOLN
INTRAVENOUS | Status: DC
  Administered 2016-12-15: 10:00:00 via INTRAVENOUS

## 2016-12-15 MED ORDER — OXYCODONE HCL 5 MG PO TABS: 5.0000 mg | ORAL_TABLET | ORAL | 0 refills | Status: DC | PRN

## 2016-12-15 MED ORDER — 0.9 % SODIUM CHLORIDE (POUR BTL) OPTIME
TOPICAL | Status: DC | PRN
  Administered 2016-12-15: 1000 mL

## 2016-12-15 MED ORDER — ONDANSETRON HCL 4 MG PO TABS: 4.0000 mg | ORAL_TABLET | Freq: Four times a day (QID) | ORAL | Status: DC | PRN

## 2016-12-15 MED ORDER — FENTANYL CITRATE (PF) 250 MCG/5ML IJ SOLN
INTRAMUSCULAR | Status: AC
  Filled 2016-12-15: qty 5

## 2016-12-15 MED ORDER — ONDANSETRON HCL 4 MG/2ML IJ SOLN
INTRAMUSCULAR | Status: DC | PRN
  Administered 2016-12-15: 4 mg via INTRAVENOUS

## 2016-12-15 MED ORDER — PROPOFOL 10 MG/ML IV BOLUS
INTRAVENOUS | Status: AC
  Filled 2016-12-15: qty 20

## 2016-12-15 MED ORDER — PHENYLEPHRINE HCL 10 MG/ML IJ SOLN
INTRAVENOUS | Status: DC | PRN
  Administered 2016-12-15: 25 ug/min via INTRAVENOUS

## 2016-12-15 MED ORDER — ONDANSETRON HCL 4 MG/2ML IJ SOLN
INTRAMUSCULAR | Status: AC
  Filled 2016-12-15: qty 2

## 2016-12-15 MED ORDER — MIDAZOLAM HCL 2 MG/2ML IJ SOLN
INTRAMUSCULAR | Status: AC
  Filled 2016-12-15: qty 2

## 2016-12-15 MED ORDER — OXYCODONE HCL 5 MG PO TABS: 5.0000 mg | ORAL_TABLET | Freq: Once | ORAL | Status: DC | PRN

## 2016-12-15 MED ORDER — FENTANYL CITRATE (PF) 100 MCG/2ML IJ SOLN
INTRAMUSCULAR | Status: DC | PRN
  Administered 2016-12-15 (×4): 50 ug via INTRAVENOUS
  Administered 2016-12-15: 100 ug via INTRAVENOUS

## 2016-12-15 MED ORDER — VANCOMYCIN HCL IN DEXTROSE 1-5 GM/200ML-% IV SOLN
INTRAVENOUS | Status: AC
  Filled 2016-12-15: qty 200

## 2016-12-15 MED ORDER — PROPOFOL 10 MG/ML IV BOLUS
INTRAVENOUS | Status: DC | PRN
  Administered 2016-12-15: 30 mg via INTRAVENOUS
  Administered 2016-12-15: 40 mg via INTRAVENOUS
  Administered 2016-12-15: 20 mg via INTRAVENOUS

## 2016-12-15 MED ORDER — POVIDONE-IODINE 10 % EX SWAB: 2.0000 "application " | Freq: Once | CUTANEOUS | Status: DC

## 2016-12-15 MED ORDER — FENTANYL CITRATE (PF) 100 MCG/2ML IJ SOLN
25.0000 ug | INTRAMUSCULAR | Status: DC | PRN
  Administered 2016-12-15 (×4): 25 ug via INTRAVENOUS

## 2016-12-15 MED ORDER — ONDANSETRON HCL 4 MG/2ML IJ SOLN: 4.0000 mg | Freq: Four times a day (QID) | INTRAMUSCULAR | Status: DC | PRN

## 2016-12-15 MED ORDER — TICAGRELOR 90 MG PO TABS
90.0000 mg | ORAL_TABLET | Freq: Two times a day (BID) | ORAL | Status: DC
  Administered 2016-12-15 – 2016-12-17 (×4): 90 mg via ORAL
  Filled 2016-12-15 (×4): qty 1

## 2016-12-15 MED ORDER — ASPIRIN EC 325 MG PO TBEC: 325.0000 mg | DELAYED_RELEASE_TABLET | Freq: Every day | ORAL | 0 refills | Status: DC

## 2016-12-15 MED ORDER — HYDROCODONE-ACETAMINOPHEN 5-325 MG PO TABS
1.0000 | ORAL_TABLET | Freq: Four times a day (QID) | ORAL | Status: DC | PRN
  Administered 2016-12-15: 2 via ORAL
  Administered 2016-12-16: 1 via ORAL
  Administered 2016-12-16 – 2016-12-17 (×3): 2 via ORAL
  Filled 2016-12-15 (×2): qty 2
  Filled 2016-12-15: qty 1
  Filled 2016-12-15 (×2): qty 2

## 2016-12-15 MED ORDER — OXYCODONE HCL 5 MG/5ML PO SOLN: 5.0000 mg | Freq: Once | ORAL | Status: DC | PRN

## 2016-12-15 MED ORDER — ACETAMINOPHEN 650 MG RE SUPP: 650.0000 mg | Freq: Four times a day (QID) | RECTAL | Status: DC | PRN

## 2016-12-15 MED ORDER — CEFAZOLIN SODIUM-DEXTROSE 2-4 GM/100ML-% IV SOLN
2.0000 g | Freq: Four times a day (QID) | INTRAVENOUS | Status: AC
  Administered 2016-12-15 – 2016-12-16 (×3): 2 g via INTRAVENOUS
  Filled 2016-12-15 (×3): qty 100

## 2016-12-15 MED ORDER — TRANEXAMIC ACID 1000 MG/10ML IV SOLN
2000.0000 mg | Freq: Once | INTRAVENOUS | Status: DC
  Filled 2016-12-15: qty 20

## 2016-12-15 MED ORDER — SODIUM CHLORIDE 0.9 % IR SOLN
Status: DC | PRN
  Administered 2016-12-15: 3000 mL

## 2016-12-15 MED ORDER — ACETAMINOPHEN 325 MG PO TABS: 650.0000 mg | ORAL_TABLET | Freq: Four times a day (QID) | ORAL | Status: DC | PRN

## 2016-12-15 MED ORDER — METOCLOPRAMIDE HCL 5 MG/ML IJ SOLN: 5.0000 mg | Freq: Three times a day (TID) | INTRAMUSCULAR | Status: DC | PRN

## 2016-12-15 MED ORDER — VANCOMYCIN HCL IN DEXTROSE 1-5 GM/200ML-% IV SOLN
1000.0000 mg | Freq: Once | INTRAVENOUS | Status: AC
  Administered 2016-12-15: 1000 mg via INTRAVENOUS

## 2016-12-15 MED ORDER — SUGAMMADEX SODIUM 200 MG/2ML IV SOLN
INTRAVENOUS | Status: DC | PRN
  Administered 2016-12-15: 200 mg via INTRAVENOUS

## 2016-12-15 MED ORDER — METHOCARBAMOL 1000 MG/10ML IJ SOLN
500.0000 mg | Freq: Four times a day (QID) | INTRAVENOUS | Status: DC | PRN
  Filled 2016-12-15: qty 5

## 2016-12-15 SURGICAL SUPPLY — 41 items
BAG DECANTER FOR FLEXI CONT (MISCELLANEOUS) ×3 IMPLANT
CAPT HIP HEMI 2 ×3 IMPLANT
CELLS DAT CNTRL 66122 CELL SVR (MISCELLANEOUS) ×1 IMPLANT
COVER SURGICAL LIGHT HANDLE (MISCELLANEOUS) ×3 IMPLANT
DRAPE C-ARM 42X72 X-RAY (DRAPES) ×3 IMPLANT
DRAPE STERI IOBAN 125X83 (DRAPES) ×3 IMPLANT
DRAPE U-SHAPE 47X51 STRL (DRAPES) ×6 IMPLANT
DRSG AQUACEL AG ADV 3.5X10 (GAUZE/BANDAGES/DRESSINGS) ×3 IMPLANT
DRSG MEPILEX BORDER 4X8 (GAUZE/BANDAGES/DRESSINGS) ×3 IMPLANT
DURAPREP 26ML APPLICATOR (WOUND CARE) ×3 IMPLANT
ELECT BLADE 4.0 EZ CLEAN MEGAD (MISCELLANEOUS) ×3
ELECT REM PT RETURN 9FT ADLT (ELECTROSURGICAL) ×3
ELECTRODE BLDE 4.0 EZ CLN MEGD (MISCELLANEOUS) ×1 IMPLANT
ELECTRODE REM PT RTRN 9FT ADLT (ELECTROSURGICAL) ×1 IMPLANT
GLOVE SKINSENSE NS SZ7.5 (GLOVE) ×2
GLOVE SKINSENSE STRL SZ7.5 (GLOVE) ×1 IMPLANT
GLOVE SURG SYN 7.5  E (GLOVE) ×4
GLOVE SURG SYN 7.5 E (GLOVE) ×2 IMPLANT
GOWN SRG XL XLNG 56XLVL 4 (GOWN DISPOSABLE) ×1 IMPLANT
GOWN STRL NON-REIN XL XLG LVL4 (GOWN DISPOSABLE) ×2
GOWN STRL REUS W/ TWL LRG LVL3 (GOWN DISPOSABLE) IMPLANT
GOWN STRL REUS W/TWL LRG LVL3 (GOWN DISPOSABLE)
HANDPIECE INTERPULSE COAX TIP (DISPOSABLE) ×2
HOOD PEEL AWAY FLYTE STAYCOOL (MISCELLANEOUS) ×6 IMPLANT
IV NS IRRIG 3000ML ARTHROMATIC (IV SOLUTION) ×3 IMPLANT
KIT BASIN OR (CUSTOM PROCEDURE TRAY) ×3 IMPLANT
MARKER SKIN DUAL TIP RULER LAB (MISCELLANEOUS) ×3 IMPLANT
PACK TOTAL JOINT (CUSTOM PROCEDURE TRAY) ×3 IMPLANT
PACK UNIVERSAL I (CUSTOM PROCEDURE TRAY) ×3 IMPLANT
RTRCTR WOUND ALEXIS 18CM MED (MISCELLANEOUS) ×3
SAW OSC TIP CART 19.5X105X1.3 (SAW) ×3 IMPLANT
SET HNDPC FAN SPRY TIP SCT (DISPOSABLE) ×1 IMPLANT
STAPLER VISISTAT 35W (STAPLE) ×3 IMPLANT
SUT ETHIBOND 2 V 37 (SUTURE) ×3 IMPLANT
SUT VIC AB 1 CT1 27 (SUTURE) ×2
SUT VIC AB 1 CT1 27XBRD ANBCTR (SUTURE) ×1 IMPLANT
SUT VIC AB 2-0 CT1 27 (SUTURE) ×2
SUT VIC AB 2-0 CT1 TAPERPNT 27 (SUTURE) ×1 IMPLANT
TOWEL OR 17X26 10 PK STRL BLUE (TOWEL DISPOSABLE) ×3 IMPLANT
TRAY CATH 16FR W/PLASTIC CATH (SET/KITS/TRAYS/PACK) ×3 IMPLANT
YANKAUER SUCT BULB TIP NO VENT (SUCTIONS) ×3 IMPLANT

## 2016-12-15 NOTE — Progress Notes (Signed)
Pt back from OR s/p left hip replacement dressing dry and intact.

## 2016-12-15 NOTE — Progress Notes (Signed)
Report given to Lamar, [place] Nurse.

## 2016-12-15 NOTE — Transfer of Care (Signed)
Immediate Anesthesia Transfer of Care Note  Patient: Sandra Rice  Procedure(s) Performed: Procedure(s): ANTERIOR APPROACH HEMI HIP ARTHROPLASTY (Left)  Patient Location: PACU  Anesthesia Type:General  Level of Consciousness: awake, alert  and oriented  Airway & Oxygen Therapy: Patient Spontanous Breathing and Patient connected to nasal cannula oxygen  Post-op Assessment: Report given to RN, Post -op Vital signs reviewed and stable and Patient moving all extremities  Post vital signs: Reviewed and stable  Last Vitals:  Vitals:   12/15/16 0627 12/15/16 1150  BP: 134/73   Pulse: 86 (!) 101  Resp: 17 (!) 23  Temp: 37.1 C     Last Pain:  Vitals:   12/15/16 0627  TempSrc: Oral  PainSc:       Patients Stated Pain Goal: 0 (12/14/16 1857)  Complications: No apparent anesthesia complications

## 2016-12-15 NOTE — Discharge Instructions (Signed)
° ° °  1. Change dressings as needed 2. May shower but keep incisions covered and dry 3. Take aspirin and brillinta to prevent blood clots 4. Take stool softeners as needed 5. Take pain meds as needed

## 2016-12-15 NOTE — Progress Notes (Signed)
  Date: 12/15/2016  Patient name: Rika Beam  Medical record number: [id number]  Date of birth: 1956/01/24   I have seen and evaluated this patient and I have discussed the plan of care with the house staff. Please see their note for complete details. I concur with their findings with the following additions/corrections: Ms Bogucki is now post-op and c/o hip pain. States has had pain meds since returned from OR and didn't help. Not had anything by PO - no N/V - has diet order in. Understands PT to start tomorrow. Will need bisphosphonate at D/C. Had fx od R hip last yr, now fx L hip. Doesn't need DEXA to confirm osteoporosis. Dilantin can contribute to Osteoporosis - reason for med use unlcear. Would suggest clarifying use and if possible, D/Cing med.  Burns Spain, MD 12/15/2016, 3:25 PM

## 2016-12-15 NOTE — Op Note (Signed)
ANTERIOR APPROACH HEMI HIP ARTHROPLASTY  Procedure Note Sandra Rice   [id number]  Pre-op Diagnosis: femoral neck fx     Post-op Diagnosis: same   Operative Procedures  1. Prosthetic replacement for femoral neck fracture. CPT 450 [id number]  Personnel  Surgeon(s): Tarry Kos, MD   Anesthesia: general  Prosthesis: depuy Femur: corail ka 10 Head: 42 mm size: +1.5 Bearing Type: bipolar  Hip Hemiarthroplasty (Anterior Approach) Op Note:  After informed consent was obtained and the operative extremity marked in the holding area, the patient was brought back to the operating room and placed supine on the HANA table. Next, the operative extremity was prepped and draped in normal sterile fashion. Surgical timeout occurred verifying patient identification, surgical site, surgical procedure and administration of antibiotics.  A modified anterior Smith-Peterson approach to the hip was performed, using the interval between tensor fascia lata and sartorius.  Dissection was carried bluntly down onto the anterior hip capsule. The lateral femoral circumflex vessels were identified and coagulated. A capsulotomy was performed and the capsular flaps tagged for later repair.  Fluoroscopy was utilized to prepare for the femoral neck cut. The neck osteotomy was performed. The femoral head was removed and found a 42 mm head was the appropriate fit.    We then turned our attention to the femur.  After placing the femoral hook, the leg was taken to externally rotated, extended and adducted position taking care to perform soft tissue releases to allow for adequate mobilization of the femur. Soft tissue was cleared from the shoulder of the greater trochanter and the hook elevator used to improve exposure of the proximal femur. Sequential broaching performed up to a size 10. Trial neck and head were placed. The leg was brought back up to neutral and the construct reduced. The position and sizing of components, offset and  leg lengths were checked using fluoroscopy. Stability of the construct was checked in extension and external rotation without any subluxation or impingement of prosthesis. We dislocated the prosthesis, dropped the leg back into position, removed trial components, and irrigated copiously. The final stem and head was then placed, the leg brought back up, the system reduced and fluoroscopy used to verify positioning.  We irrigated, obtained hemostasis and closed the capsule using #2 ethibond suture.  The fascia was closed with #1 vicryl plus, the deep fat layer was closed with 0 vicryl, the subcutaneous layers closed with 2.0 Vicryl Plus and the skin closed with staples. A sterile dressing was applied. The patient was awakened in the operating room and taken to recovery in stable condition. All sponge, needle, and instrument counts were correct at the end of the case.   Position: supine  Complications: none.  Time Out: performed   Drains/Packing: none  Estimated blood loss: 300 cc  Returned to Recovery Room: in good condition.   Antibiotics: yes   Mechanical VTE (DVT) Prophylaxis: sequential compression devices, TED thigh-high  Chemical VTE (DVT) Prophylaxis: resume brillinta  Fluid Replacement: Crystalloid: see anesthesia record  Specimens Removed: 1 to pathology   Sponge and Instrument Count Correct? yes   PACU: portable radiograph - low AP   Admission: inpatient status, start PT & OT POD#1  Plan/RTC: Return in 2 weeks for staple removal. Return in 6 weeks to see MD.  Weight Bearing/Load Lower Extremity: full  Hip precautions: none Suture Removal: 10-14 days  Betadine to incision twice daily once dressing is removed on POD#7  N. Glee Arvin, MD [facility] [phone] 11:42 AM  Implant Name Type Inv. Item Serial No. Manufacturer Lot No. LRB No. Used  STEM CORAIL KA10 - [id number] Stem STEM CORAIL KA10  DEPUY SYNTHES [id number] Left 1  BIPOLAR AML DEPUY -  [id number] Hips BIPOLAR AML DEPUY  DEPUY SYNTHES [id number] Left 1  HIP BALL ARTICU DEPUY - [id number] Hips HIP BALL ARTICU DEPUY   DEPUY SYNTHES [id number] Left 1

## 2016-12-15 NOTE — Progress Notes (Signed)
Pt alert and responsive for partial left hip replacement today at 0936 with consent, NPO midnight, PCR + MRSA, on isolation, no complain of pain at this time.

## 2016-12-15 NOTE — Progress Notes (Signed)
Pt BP was 181/80 at left upper arm rechecked BP 117/84 at right lower leg, pt alert and oriented no s/s of distress noted at this time.

## 2016-12-15 NOTE — Progress Notes (Addendum)
Pt being transferred to [facility] for surgery in the morning. Report called to [place], Charity fundraiser. All questions and concerns addressed. Pt in no distress or pain. VSS. [facility] called to transport pt. Pt's daughter, Karoline Caldwell called and updated. Will continue to monitor.

## 2016-12-15 NOTE — Anesthesia Procedure Notes (Signed)
Procedure Name: Intubation Date/Time: 12/15/2016 10:08 AM Performed by: Rush Farmer E Pre-anesthesia Checklist: Patient identified, Emergency Drugs available, Suction available and Patient being monitored Patient Re-evaluated:Patient Re-evaluated prior to inductionOxygen Delivery Method: Circle system utilized Preoxygenation: Pre-oxygenation with 100% oxygen Intubation Type: IV induction Ventilation: Mask ventilation without difficulty Laryngoscope Size: Mac and 3 Grade View: Grade I Tube type: Oral Tube size: 7.0 mm Number of attempts: 1 Airway Equipment and Method: Stylet Placement Confirmation: ETT inserted through vocal cords under direct vision,  positive ETCO2 and breath sounds checked- equal and bilateral Secured at: 21 cm Tube secured with: Tape Dental Injury: Teeth and Oropharynx as per pre-operative assessment

## 2016-12-15 NOTE — Progress Notes (Addendum)
Interval history Ms. Sandra Rice is a 61 year old woman with past medical history of 3 CVAs in 2009 with residual cognitive deficit and dysarthria, hypertension, COPD, CAD with stent placement and bypass graft, peripheral arterial disease with previous aorta femoral bypass graft, and history of substance abuse who has had multiple mechanical falls and had closed right hip fracture 07/2016 recent admission for mechanical fall presented to [place] yesterday with left hip fracture after a fall. She does not have a good memory of the event, she said that she tripped on something but cannot remember what she tripped on. She denies any prodromal symptoms, chest pain, dizziness or syncope. She reports no pain or other concerns at this time.  Subjective: Ms. Lievanos appears sad when seen on rounds this morning. She reports no concerns and says that she is not having pain. She is updated on the plan that she will go for orthopedic surgery of her hip today.   Objective:  Vital signs in last 24 hours: Vitals:   12/14/16 1949 12/14/16 2254 12/15/16 0240 12/15/16 0627  BP:  (!) 158/74 116/63 134/73  Pulse:  94 78 86  Resp:  16 16 17   Temp:  98.1 F (36.7 C) 98.7 F (37.1 C) 98.7 F (37.1 C)  TempSrc:  Oral Oral Oral  SpO2: 98% 99% 99% 95%  Weight:      Height:       Physical Exam  Constitutional: She is well-developed, well-nourished, and in no distress. No distress.  HENT:  Head: Normocephalic and atraumatic.  Eyes: EOM are normal. Right eye exhibits no discharge. Left eye exhibits no discharge. No scleral icterus.  Cardiovascular: Normal rate and regular rhythm.   No murmur heard. No peripheral edema dorsalis pedis pulses are palpable  Pulmonary/Chest: Effort normal and breath sounds normal. No respiratory distress.  Exam limited by poor inspiratory effort not amendable to prompting   Abdominal: Soft. Bowel sounds are normal. She exhibits no distension. There is no tenderness.  Musculoskeletal:    has an ice pack under her left hip  Neurological: She is alert. No cranial nerve deficit.  Strength in upper and lower extremities equal and intact, appropriate for her level of deconditioning  Skin: Skin is warm and dry. She is not diaphoretic.  She has a large bruise over the entirety of her left shoulder and a large bruise over her left mid arm    Assessment/Plan:    Subcapital fracture of hip, left, closed, initial encounter [facility])   [facility] Orthopedic surgery evaluated her yesterday and recommended partial hip replacement which will happen this morning. - follow up vitamin D level  - Continue prednisone tape - PT evaluation after surgery  - norco 5-10 mg q6h for pain, morphine 0.5 mg q2h PRN, and oxy IR 5-10 mg q4H PRN breakthrough pain  - will need post procedure DVT ppx -Her fragility fractures over the past year are consistent with a diagnosis of osteoporosis. She had been on aShe will need to begin bisphosphonate therapy at the time of discharge.  -continue home med dilantin 100 mg qAM and 200 mg qPM     COPD (chronic obstructive pulmonary disease) (HCC) No wheezing, no respiratory distress.  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  - continue home med dilantin  - F/U dilantin level   Hypertension  Normotensive at this time. Home meds were held at last discharge for normotension throughout the admission with medications being held ( amlodipine 5 and lisinopril 5).  - continue to monitor     Pressure injury of skin - continue wound care   Anxiety/ Depression  Continue home med citalopram 10 mg  qd    Dispo: Anticipated discharge in approximately 2-4 day(s).   Eulah Pont, MD 12/15/2016, 11:02 AM Pager: [phone]

## 2016-12-15 NOTE — Anesthesia Preprocedure Evaluation (Addendum)
Anesthesia Evaluation  Patient identified by MRN, date of birth, ID band  Reviewed: Allergy & Precautions, NPO status , Patient's Chart, lab work & pertinent test results  History of Anesthesia Complications Negative for: history of anesthetic complications  Airway Mallampati: I  TM Distance: >3 FB Neck ROM: Full    Dental  (+) Edentulous Upper, Edentulous Lower   Pulmonary COPD, former smoker,    breath sounds clear to auscultation       Cardiovascular hypertension, Pt. on medications + CAD, + Past MI and + Peripheral Vascular Disease   Rhythm:Regular     Neuro/Psych CVA negative psych ROS   GI/Hepatic GERD  ,  Endo/Other  negative endocrine ROS  Renal/GU Renal disease     Musculoskeletal   Abdominal   Peds  Hematology negative hematology ROS (+)   Anesthesia Other Findings   Reproductive/Obstetrics                            Anesthesia Physical Anesthesia Plan  ASA: III  Anesthesia Plan: General   Post-op Pain Management:    Induction: Intravenous  PONV Risk Score and Plan: 3 and Ondansetron, Dexamethasone and Propofol  Airway Management Planned: Oral ETT  Additional Equipment: None  Intra-op Plan:   Post-operative Plan: Extubation in OR  Informed Consent: I have reviewed the patients History and Physical, chart, labs and discussed the procedure including the risks, benefits and alternatives for the proposed anesthesia with the patient or authorized representative who has indicated his/her understanding and acceptance.   Dental advisory given  Plan Discussed with: CRNA and Anesthesiologist  Anesthesia Plan Comments:        Anesthesia Quick Evaluation

## 2016-12-15 NOTE — Discharge Summary (Signed)
Name: Sandra Rice MRN: [id number] DOB: 06/07/1956 61 y.o. 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 Pain/fever   albuterol (2.5 MG/3ML) 0.083% nebulizer solution Commonly known as:  PROVENTIL Take 2.5 mg by nebulization every 4 (four) hours as needed for wheezing or shortness of breath.   aspirin 81 MG chewable tablet Chew 81 mg by mouth daily.   atorvastatin 80 MG tablet Commonly known as:  LIPITOR Take 80 mg by mouth at bedtime.   budesonide-formoterol 160-4.5 MCG/ACT inhaler Commonly known as:  SYMBICORT Inhale 2 puffs into the lungs 2 (two) times daily.   Calcium-Vitamin D 600-400 MG-UNIT Tabs Take 1 tablet by mouth 2 (two) times daily.   citalopram 10 MG tablet Commonly known as:  CELEXA Take 10 mg by mouth daily.   enoxaparin 40 MG/0.4ML injection Commonly known as:  LOVENOX Inject 0.4 mLs (40 mg total) into the skin daily.   HYDROcodone-acetaminophen 5-325 MG tablet Commonly known as:  NORCO/VICODIN Take 1 tablet by mouth every 6 (six) hours as needed for moderate pain. pain What changed:  when to take this  reasons to take this     levETIRAcetam 500 MG tablet Commonly known as:  KEPPRA Take 1 tablet (500 mg total) by mouth 2 (two) times daily.   metoprolol tartrate 100 MG tablet Commonly known as:  LOPRESSOR Take 100 mg by mouth 2 (two) times daily.   multivitamin with minerals tablet Take 1 tablet by mouth daily.   nicotine 14 mg/24hr patch Commonly known as:  NICODERM CQ - dosed in mg/24 hours Place 14 mg onto the skin daily.   nitroGLYCERIN 0.4 MG SL tablet Commonly known as:  NITROSTAT Place 0.4 mg under the tongue every 5 (five) minutes as needed for chest pain.   phenytoin 100 MG ER capsule Commonly known as:  DILANTIN Take 100-200 mg by mouth See admin instructions. Take 100 mg every morning  Take 200 mg at bedtime   Potassium Chloride ER 20 MEQ Tbcr Take 20 mEq by mouth daily. What changed:  medication strength  how much to take   ticagrelor 90 MG Tabs tablet Commonly known as:  BRILINTA Take 90 mg by mouth 2 (two) times daily.       Disposition and follow-up:   Sandra Rice was discharged from [facility] in Stable condition.  At the hospital follow up visit please address:  1.  Patient has been started on Keppra. Plan is to taper patient off of Dilantin due to increased risk of osteoporosis on Dilantin. -Patient has been started on Keppra 500mg  BID and will continue on this medication twice daily. -Patient will continue Dilantin 100mg  am  and 200mg  pm until July 4th concurrent with Keppra. -Patient will start Dilantin 100mg  am and 100mg  pm for one 7days after July 4th concurrent with Keppra. -Patient will then start Dilantin 100mg  pm only for an additional 7 days while still continuing Keppra. -Then stop Dilantin and continue Keppra.  Patient will need DVT prophylaxis with Lovenox for 35 days status post hip surgery. She is also currently on ASA and brilinta for stroke, please monitor for any signs of bleeding.  Hip fracture/reinforce follow-up with orthopedics  in 2 weeks for staple removal and wound check   2.  Labs / imaging needed at time of follow-up: CBC, BMP  3.  Pending labs/ test needing follow-up: None at this time.  Follow-up Appointments: Follow-up Information    Tarry Kos, MD Follow up in 2 week(s).   Specialty:  Orthopedic Surgery Why:  For suture removal, For wound re-check Contact information: [address] [place] [address] [phone]        Regino Bellow, MD Follow up on 12/31/2016.   Specialty:  Family Medicine Why:  Patient will see Dr. Karyl Kinnier at 10:30 am. Contact information: [address] 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 She does not have a good memory of the event, she said that she tripped on something but could not remember what she tripped on. She denies any prodromal symptoms, chest pain, dizziness or syncope. She reports no pain or other concerns at this time. She went for uncomplicated hip hemiarthroplasty on 6/30. She did not experience significant post operative complications  other than expected post op surgical site pain and a mild drop in hemoglobin which was explained as being expected by the orthopedic surgeon. She was removed from telemetry as her cardiac status had remained within normal limits. IV fluids were discontinued as she progressed with her diet to regular foods. She denied abdominal pain, chest pain, headache or respiratory difficulty. Left sided hip pain persisted but was relieved upon receiving a dose of percocet as prescribed. Potasium levels hovered around 3.5 during the patients stay but had remained within normal limits until day of discharge. Patients dose of potasium tablet increased to from .    1.   Discharge Vitals:   BP (!) 154/67    Pulse 92    Temp 99.1 F (37.3 C)    Resp 17    Ht 5\' 2"  (1.575 m)    Wt 149 lb 6.4 oz (67.8 kg)    SpO2 92%    BMI 27.33 kg/m   Pertinent Labs, Studies, and Procedures:   Hemoglobin was 7.9 upon  discharge and expected loss based on surgical procedure. Please follow up with CBC prior to PCP visit. Potassium was 3.3 upon discharge, please continue potasium tablets and follow-up with BMP prior to PCP.   Discharge Instructions: Discharge Instructions    Call MD for:  persistant nausea and vomiting    Complete by:  As directed    Call MD for:  redness, tenderness, or signs of infection (pain, swelling, redness, odor or green/yellow discharge around incision site)    Complete by:  As directed    Call MD for:  severe uncontrolled pain    Complete by:  As directed    Call MD for:  temperature >100.4    Complete by:  As directed    Diet - low sodium heart healthy    Complete by:  As directed    Increase activity slowly    Complete by:  As directed    Weight bearing as tolerated    Complete by:  As directed       Signed: Lanelle Bal, MD Internal Medicine, PGY-1 Pager # [phone]

## 2016-12-16 DIAGNOSIS — W19XXXA Unspecified fall, initial encounter: Secondary | ICD-10-CM

## 2016-12-16 DIAGNOSIS — I700 Atherosclerosis of aorta: Secondary | ICD-10-CM

## 2016-12-16 LAB — CBC
HEMATOCRIT: 28.2 % — AB (ref 36.0–46.0)
HEMOGLOBIN: 8.8 g/dL — AB (ref 12.0–15.0)
MCH: 27.8 pg (ref 26.0–34.0)
MCHC: 31.2 g/dL (ref 30.0–36.0)
MCV: 89 fL (ref 78.0–100.0)
Platelets: 189 10*3/uL (ref 150–400)
RBC: 3.17 MIL/uL — AB (ref 3.87–5.11)
RDW: 15 % (ref 11.5–15.5)
WBC: 6.3 10*3/uL (ref 4.0–10.5)

## 2016-12-16 LAB — CULTURE, BLOOD (ROUTINE X 2)
CULTURE: NO GROWTH
Culture: NO GROWTH
Special Requests: ADEQUATE
Special Requests: ADEQUATE

## 2016-12-16 LAB — BASIC METABOLIC PANEL
Anion gap: 10 (ref 5–15)
BUN: 6 mg/dL (ref 6–20)
CHLORIDE: 98 mmol/L — AB (ref 101–111)
CO2: 24 mmol/L (ref 22–32)
CREATININE: 0.65 mg/dL (ref 0.44–1.00)
Calcium: 8.1 mg/dL — ABNORMAL LOW (ref 8.9–10.3)
GFR calc Af Amer: >60 mL/min (ref >60)
GFR calc non Af Amer: >60 mL/min (ref >60)
Glucose, Bld: 107 mg/dL — ABNORMAL HIGH (ref 65–99)
POTASSIUM: 3.4 mmol/L — AB (ref 3.5–5.1)
Sodium: 132 mmol/L — ABNORMAL LOW (ref 135–145)

## 2016-12-16 MED ORDER — LEVETIRACETAM 500 MG PO TABS
1000.0000 mg | ORAL_TABLET | Freq: Once | ORAL | Status: AC
  Administered 2016-12-16: 1000 mg via ORAL
  Filled 2016-12-16: qty 2

## 2016-12-16 NOTE — Progress Notes (Signed)
Internal Medicine Attending  Date: 12/16/2016  Patient name: Sandra Rice Medical record number: [id number] Date of birth: 06-Dec-1955 Age: 61 y.o. Gender: female  I saw and evaluated the patient. I reviewed the resident's note by Dr. Lenice Llamas and I agree with the resident's findings and plans as documented in his progress note.  When seen on rounds this morning [name] noted some mild soreness at the surgical site but otherwise was without any complaints. She is working with occupational and physical therapy and we are awaiting transfer to a skilled nursing facility for further rehabilitation. We are minimizing tethering of the patient and she will be removed from telemetry given lack of indication as well as IV fluids given she has got adequate oral intake. We are weaning the oxygen (now down to 1 L/min) in hopes of having it completely off by the time of transfer to the skilled nursing facility. I suspect her mild oxygen needs may be related to some perioperative atelectasis related to her recent general anesthesia.

## 2016-12-16 NOTE — Assessment & Plan Note (Signed)
Continue current home medication treatment of atherosclerosis.

## 2016-12-16 NOTE — Evaluation (Signed)
Physical Therapy Evaluation Patient Details Name: Sandra Rice MRN: [id number] DOB: 20-Feb-1956 Today's Date: 12/16/2016   History of Present Illness  Pt is a 61 y.o. female presenting after fall with L hip fracture. Now s/p prosthetic replacement for femoral neck fracture on 6/30. PMHx: CAD, GERD, HTN, MI, CVA, R hip replacement.  Clinical Impression  Pt is up to side of bed with help due to her pain in L hip, used bed pad to support and protect the painful joint.  Pt is assisted back to bed after mult stands due to having been up and now is in pain.  Her plan is to progress to SNF which was where she lived when fall occurred.  Her final plan is to get home which is going to be much safer with SNF rehab to increase safety with walker and with her family.  Follow acutely to decrease her SNF stay, focusing on strength LLE and ability to functionally use LLE to stand and take steps.    Follow Up Recommendations SNF    Equipment Recommendations  None recommended by PT    Recommendations for Other Services OT consult     Precautions / Restrictions Precautions Precautions: Fall Precaution Comments: multiple falls recently Required Braces or Orthoses: Other Brace/Splint Other Brace/Splint: R wrist splint Restrictions Weight Bearing Restrictions: No LLE Weight Bearing: Weight bearing as tolerated      Mobility  Bed Mobility Overal bed mobility: Needs Assistance Bed Mobility: Supine to Sit;Sit to Supine     Supine to sit: Mod assist Sit to supine: Max assist   General bed mobility comments: Helped both LE's to side of bed but LLE more due to pain  Transfers Overall transfer level: Needs assistance Equipment used: Rolling walker (2 wheeled) Transfers: Sit to/from Stand Sit to Stand: Mod assist         General transfer comment: mod to power up due to pain and fatigue from being OOB  Ambulation/Gait             General Gait Details: unable to do more than a couple  steps  Stairs            Wheelchair Mobility    Modified Rankin (Stroke Patients Only)       Balance Overall balance assessment: Needs assistance;History of Falls Sitting-balance support: Feet supported Sitting balance-Leahy Scale: Fair     Standing balance support: Bilateral upper extremity supported Standing balance-Leahy Scale: Poor Standing balance comment: walker and PT are assisting but splint is limit as well                             Pertinent Vitals/Pain Pain Assessment: 0-10 Pain Score: 8  Pain Location: L hip Pain Descriptors / Indicators: Aching;Operative site guarding;Grimacing Pain Intervention(s): Limited activity within patient's tolerance;Monitored during session;Premedicated before session;Repositioned;Ice applied    Home Living Family/patient expects to be discharged to:: Skilled nursing facility               Home Equipment: Dan Humphreys - 2 wheels;Wheelchair - manual Additional Comments: [facility]    Prior Function Level of Independence: Needs assistance   Gait / Transfers Assistance Needed: uses RW for transfers and w/c for mobility primarily. transfers wtih assist of staff at [facility]  ADL's / Homemaking Assistance Needed: assisted by [facility] staff s/p stroke        Hand Dominance        Extremity/Trunk Assessment  Upper Extremity Assessment Upper Extremity Assessment: Generalized weakness;RUE deficits/detail RUE Deficits / Details: R wrist splint from fall injury, pt thinks is reduced WB RUE: Unable to fully assess due to immobilization RUE Coordination: decreased gross motor;decreased fine motor    Lower Extremity Assessment Lower Extremity Assessment: Generalized weakness;LLE deficits/detail LLE Deficits / Details: LLE pain and edema, poor ROM LLE: Unable to fully assess due to pain LLE Coordination: decreased fine motor;decreased gross motor    Cervical / Trunk Assessment Cervical /  Trunk Assessment: Kyphotic  Communication   Communication: HOH  Cognition Arousal/Alertness: Awake/alert Behavior During Therapy: WFL for tasks assessed/performed Overall Cognitive Status: No family/caregiver present to determine baseline cognitive functioning Area of Impairment: Attention;Following commands;Safety/judgement;Awareness;Problem solving                   Current Attention Level: Selective Memory: Decreased recall of precautions;Decreased short-term memory Following Commands: Follows one step commands with increased time Safety/Judgement: Decreased awareness of safety;Decreased awareness of deficits Awareness: Intellectual Problem Solving: Slow processing;Decreased initiation;Difficulty sequencing;Requires verbal cues General Comments: slow to respond to questions, not sure if meds      General Comments General comments (skin integrity, edema, etc.): O2 in place based on pt comfort and previous note    Exercises     Assessment/Plan    PT Assessment Patient needs continued PT services  PT Problem List Decreased strength;Decreased range of motion;Decreased activity tolerance;Decreased balance;Decreased mobility;Decreased coordination;Decreased cognition;Decreased knowledge of use of DME;Decreased knowledge of precautions;Decreased safety awareness;Decreased skin integrity;Pain;Cardiopulmonary status limiting activity       PT Treatment Interventions DME instruction;Gait training;Functional mobility training;Therapeutic activities;Therapeutic exercise;Balance training;Neuromuscular re-education;Patient/family education    PT Goals (Current goals can be found in the Care Plan section)  Acute Rehab PT Goals Patient Stated Goal: get up to move, feel better PT Goal Formulation: With patient Time For Goal Achievement: 12/30/16 Potential to Achieve Goals: Good    Frequency Min 2X/week   Barriers to discharge  (pt is a SNF resident recently) at home has no help  prev but pt thinks her daughters will assist after SNF    Co-evaluation               AM-PAC PT "6 Clicks" Daily Activity  Outcome Measure Difficulty turning over in bed (including adjusting bedclothes, sheets and blankets)?: Total Difficulty moving from lying on back to sitting on the side of the bed? : Total Difficulty sitting down on and standing up from a chair with arms (e.g., wheelchair, bedside commode, etc,.)?: Total Help needed moving to and from a bed to chair (including a wheelchair)?: A Lot Help needed walking in hospital room?: A Lot Help needed climbing 3-5 steps with a railing? : Total 6 Click Score: 8    End of Session Equipment Utilized During Treatment: Oxygen;Gait belt Activity Tolerance: Patient limited by fatigue;Patient limited by pain Patient left: in bed;with call bell/phone within reach;with bed alarm set Nurse Communication: Mobility status PT Visit Diagnosis: Unsteadiness on feet (R26.81);History of falling (Z91.81);Muscle weakness (generalized) (M62.81);Difficulty in walking, not elsewhere classified (R26.2)    Time: 5456-2563 PT Time Calculation (min) (ACUTE ONLY): 33 min   Charges:   PT Evaluation $PT Eval High Complexity: 1 Procedure PT Treatments $Therapeutic Activity: 8-22 mins   PT G Codes:   PT G-Codes **NOT FOR INPATIENT CLASS** Functional Assessment Tool Used: AM-PAC 6 Clicks Basic Mobility     Ivar Drape 12/16/2016, 3:10 PM   Samul Dada, PT MS Acute Rehab Dept. Number: [facility] [phone]  and [place]

## 2016-12-16 NOTE — Assessment & Plan Note (Addendum)
Continue pain management with norco 5/325 q6PRN, morphine 0.52mg  q2PRN and oxy IR 5-10 q4 PRN. Continue surgical site care as appropriate.

## 2016-12-16 NOTE — Progress Notes (Signed)
Orthopedic Tech Progress Note Patient Details:  Sandra Rice [birth year] [id number]  Ortho Devices Ortho Device/Splint Location: Trapeze bar Ortho Device/Splint Interventions: Application   Saul Fordyce 12/16/2016, 2:41 PM

## 2016-12-16 NOTE — Assessment & Plan Note (Signed)
Discontinue Tele, IV fluids to decrease mechanical fall risk secondary to entanglement.

## 2016-12-16 NOTE — Progress Notes (Signed)
° °  Subjective: The patient was lying in bed comfortably upon entering the room. She did not complain of any acute issues today other than well controlled pain in the area of her recent surgery. She understood that she is being treated for her fractured hip and possible loss of bone density leading to the injuries.   Objective:  Vital signs in last 24 hours: Vitals:   12/15/16 2158 12/15/16 2258 12/16/16 0549 12/16/16 0912  BP: (!) 186/95  115/86   Pulse: (!) 118  92   Resp: 20  20   Temp: 98.8 F (37.1 C)  97.8 F (36.6 C)   TempSrc: Oral  Oral   SpO2: 91% 92% 93% (!) 88%  Weight:      Height:       Review of Systems  Constitutional: Negative for fever.  Respiratory: Negative for cough and hemoptysis.   Cardiovascular: Negative for chest pain and palpitations.  Gastrointestinal: Negative for abdominal pain and vomiting.  Neurological: Negative for dizziness.    Physical Exam  Constitutional: She appears well-developed and well-nourished.  HENT:  Head: Normocephalic.  Cardiovascular: Regular rhythm, normal heart sounds and intact distal pulses.   Pulmonary/Chest: Breath sounds normal.  Abdominal: Soft. Bowel sounds are normal.  Neurological: She is alert.  Skin: Skin is warm and dry. Capillary refill takes less than 2 seconds.  Psychiatric: She has a normal mood and affect.    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 Will discuss altering her seizure treatment with neuro.  Continue Norco 5/325 q6 PRN for pain. Continue  surgical site care as per Ortho directions   Fall risk: -Discontinued Tele and IV to decrease mechanical fall risk  Thoracic aortic atherosclerosis: - Continue atorvastatin, 80mg  daily, metoprolol tartrate 100mg  BID, ticagrelor 90mg  tablet, nitroglycerin 0.4mg  sublingual PRN, ASA 325 daily.  COPD: -Continue albuterol 2.5mg /66ml nebulizer treatment Q4H PRN, mometasone-formoterol 200-5 MCG/ACT inhaler BID, and nicotine patch 14mg  transdermal. Keep O2 at 1L unless O2 Sat drops below 90% then titrate up to >90% keeping as near 90% as tolerated with the goal of weaning in prior to discharge.   Pressure skin injury: -Continue keeping heals off of bed and encourage. -Physical therapy consult pending.    Dispo: Anticipated discharge in approximately 2-3 day(s).   Lanelle Bal, MD Internal Medicine, PGY-1 Pager # [phone]

## 2016-12-16 NOTE — Progress Notes (Signed)
   Subjective:  Patient reports pain as mild.    Objective:   VITALS:   Vitals:   12/15/16 1401 12/15/16 2158 12/15/16 2258 12/16/16 0549  BP: 117/84 (!) 186/95  115/86  Pulse:  (!) 118  92  Resp:  20  20  Temp:  98.8 F (37.1 C)  97.8 F (36.6 C)  TempSrc:  Oral  Oral  SpO2:  91% 92% 93%  Weight:      Height:        Neurologically intact Neurovascular intact Sensation intact distally Intact pulses distally Dorsiflexion/Plantar flexion intact Incision: dressing C/D/I and no drainage No cellulitis present Compartment soft   Lab Results  Component Value Date   WBC 6.3 12/16/2016   HGB 8.8 (L) 12/16/2016   HCT 28.2 (L) 12/16/2016   MCV 89.0 12/16/2016   PLT 189 12/16/2016     Assessment/Plan:  1 Day Post-Op   - Expected postop acute blood loss anemia - will monitor for symptoms - Up with PT/OT - DVT ppx - SCDs, ambulation, aspirin daily, brillinta - WBAT operative extremity, no hip precautions - Pain control - SNF pending  Glee Arvin 12/16/2016, 8:11 AM [phone]

## 2016-12-16 NOTE — Assessment & Plan Note (Signed)
Continue home medications. Continue Oxygen therapy via nasal canula @ 1L unless O2 sat drops below 90%. Increase by 1L as needed until stable. Plan is to discontinue Oxygen prior to discharge so slowly weaning is preferred.

## 2016-12-16 NOTE — Evaluation (Signed)
Occupational Therapy Evaluation Patient Details Name: Sandra Rice MRN: [id number] DOB: 1955/11/21 Today's Date: 12/16/2016    History of Present Illness Pt is a 61 y.o. female presenting after fall with L hip fracture. Now s/p prosthetic replacement for femoral neck fracture on 6/30. PMHx: CAD, GERD, HTN, MI, CVA, R hip replacement.   Clinical Impression   Pt reports she required assist with ADL PTA. Currently pt requires mod assist for stand pivot transfers and max assist for LB ADL. Pt planning to d/c back to SNF [facility] upon d/c; agree with SNF placement. Pt would benefit from continued skilled OT to address established goals.    Follow Up Recommendations  SNF;Supervision/Assistance - 24 hour    Equipment Recommendations  None recommended by OT    Recommendations for Other Services PT consult     Precautions / Restrictions Precautions Precautions: Fall Precaution Comments: multiple falls recently Required Braces or Orthoses: Other Brace/Splint Other Brace/Splint: R wrist splint Restrictions Weight Bearing Restrictions: Yes LLE Weight Bearing: Weight bearing as tolerated      Mobility Bed Mobility Overal bed mobility: Needs Assistance Bed Mobility: Supine to Sit     Supine to sit: Mod assist     General bed mobility comments: Assist for LLE to EOB and to scoot hips out to EOB. Pt able to elevate trunk to sitting with use of bed rail.  Transfers Overall transfer level: Needs assistance Equipment used: Rolling walker (2 wheeled) Transfers: Sit to/from [employer] Sit to Stand: Min assist Stand pivot transfers: Mod assist       General transfer comment: Min assist to boost up from EOB, mod assist for transfer due to pain in L hip with weight bearing. Cues for hand placement and sequencing transfer    Balance Overall balance assessment: Needs assistance;History of Falls Sitting-balance support: Feet supported Sitting balance-Leahy  Scale: Fair     Standing balance support: Bilateral upper extremity supported Standing balance-Leahy Scale: Poor                             ADL either performed or assessed with clinical judgement   ADL Overall ADL's : Needs assistance/impaired Eating/Feeding: Set up;Sitting   Grooming: Minimal assistance;Sitting   Upper Body Bathing: Minimal assistance;Sitting   Lower Body Bathing: Maximal assistance;Sit to/from stand   Upper Body Dressing : Minimal assistance;Sitting   Lower Body Dressing: Maximal assistance;Sit to/from stand   Toilet Transfer: Moderate assistance;Stand-pivot;RW Toilet Transfer Details (indicate cue type and reason): simulated by transfer EOB>chair         Functional mobility during ADLs: Moderate assistance;Rolling walker (for stand pivot only)       Vision         Perception     Praxis      Pertinent Vitals/Pain Pain Assessment: 0-10 Pain Score: 5  Pain Location: L hip Pain Descriptors / Indicators: Aching;Grimacing;Guarding;Operative site guarding Pain Intervention(s): Monitored during session;Repositioned;Limited activity within patient's tolerance     Hand Dominance Right   Extremity/Trunk Assessment Upper Extremity Assessment Upper Extremity Assessment: Generalized weakness;RUE deficits/detail RUE Deficits / Details: wrist cock up splint in place from previous injury from a fall RUE: Unable to fully assess due to immobilization RUE Coordination: decreased fine motor;decreased gross motor   Lower Extremity Assessment Lower Extremity Assessment: Defer to PT evaluation   Cervical / Trunk Assessment Cervical / Trunk Assessment: Kyphotic   Communication Communication Communication: HOH   Cognition Arousal/Alertness: Awake/alert Behavior During  Therapy: WFL for tasks assessed/performed Overall Cognitive Status: No family/caregiver present to determine baseline cognitive functioning                                  General Comments: Pt consistently following one step commands and answering questions appropriately.   General Comments  SpO2 89% on RA with activity; reapplied 2L supplemental O2    Exercises     Shoulder Instructions      Home Living Family/patient expects to be discharged to:: Skilled nursing facility                             Home Equipment: Dan Humphreys - 2 wheels;Wheelchair - manual   Additional Comments: [facility]      Prior Functioning/Environment Level of Independence: Needs assistance  Gait / Transfers Assistance Needed: uses RW for transfers and w/c for mobility primarily. transfers wtih assist of staff at [facility] ADL's / Homemaking Assistance Needed: assist for ADL and IADL            OT Problem List: Decreased strength;Decreased range of motion;Decreased activity tolerance;Impaired balance (sitting and/or standing);Decreased coordination;Decreased cognition;Decreased safety awareness;Decreased knowledge of use of DME or AE;Decreased knowledge of precautions;Cardiopulmonary status limiting activity;Impaired UE functional use;Pain;Increased edema      OT Treatment/Interventions: Self-care/ADL training;Therapeutic exercise;Energy conservation;DME and/or AE instruction;Therapeutic activities;Patient/family education;Balance training    OT Goals(Current goals can be found in the care plan section) Acute Rehab OT Goals Patient Stated Goal: decrease pain OT Goal Formulation: With patient Time For Goal Achievement: 12/30/16 Potential to Achieve Goals: Good ADL Goals Pt Will Transfer to Toilet: with min guard assist;stand pivot transfer;bedside commode Pt Will Perform Toileting - Clothing Manipulation and hygiene: with min guard assist;sit to/from stand Additional ADL Goal #1: Pt will perform bed mobility with min guard assist as precursor to ADL.  OT Frequency: Min 2X/week   Barriers to D/C:            Co-evaluation               AM-PAC PT "6 Clicks" Daily Activity     Outcome Measure Help from another person eating meals?: None Help from another person taking care of personal grooming?: A Little Help from another person toileting, which includes using toliet, bedpan, or urinal?: A Lot Help from another person bathing (including washing, rinsing, drying)?: A Lot Help from another person to put on and taking off regular upper body clothing?: A Little Help from another person to put on and taking off regular lower body clothing?: A Lot 6 Click Score: 16   End of Session Equipment Utilized During Treatment: Gait belt;Rolling walker;Oxygen Nurse Communication: Mobility status;Weight bearing status;Other (comment) (removed purewick)  Activity Tolerance: Patient tolerated treatment well Patient left: in chair;with call bell/phone within reach;with chair alarm set  OT Visit Diagnosis: Unsteadiness on feet (R26.81);Other abnormalities of gait and mobility (R26.89);Repeated falls (R29.6);Muscle weakness (generalized) (M62.81);Pain Pain - Right/Left: Left Pain - part of body: Hip                Time: 8938-1017 OT Time Calculation (min): 21 min Charges:  OT General Charges $OT Visit: 1 Procedure OT Evaluation $OT Eval Moderate Complexity: 1 Procedure G-Codes:     Trinidy Masterson A. Brett Albino, M.S., OTR/L Pager: [phone]  Gaye Alken 12/16/2016, 10:03 AM

## 2016-12-17 ENCOUNTER — Encounter (HOSPITAL_COMMUNITY): Payer: Self-pay | Admitting: Orthopaedic Surgery

## 2016-12-17 LAB — BASIC METABOLIC PANEL
ANION GAP: 7 (ref 5–15)
BUN: 10 mg/dL (ref 6–20)
CHLORIDE: 105 mmol/L (ref 101–111)
CO2: 25 mmol/L (ref 22–32)
Calcium: 8.6 mg/dL — ABNORMAL LOW (ref 8.9–10.3)
Creatinine, Ser: 0.66 mg/dL (ref 0.44–1.00)
GFR calc Af Amer: >60 mL/min (ref >60)
GFR calc non Af Amer: >60 mL/min (ref >60)
GLUCOSE: 125 mg/dL — AB (ref 65–99)
POTASSIUM: 3.3 mmol/L — AB (ref 3.5–5.1)
Sodium: 137 mmol/L (ref 135–145)

## 2016-12-17 LAB — CBC
HEMATOCRIT: 25.1 % — AB (ref 36.0–46.0)
HEMOGLOBIN: 7.9 g/dL — AB (ref 12.0–15.0)
MCH: 28.2 pg (ref 26.0–34.0)
MCHC: 31.5 g/dL (ref 30.0–36.0)
MCV: 89.6 fL (ref 78.0–100.0)
PLATELETS: 199 10*3/uL (ref 150–400)
RBC: 2.8 MIL/uL — AB (ref 3.87–5.11)
RDW: 15.1 % (ref 11.5–15.5)
WBC: 6.7 10*3/uL (ref 4.0–10.5)

## 2016-12-17 LAB — VITAMIN D 25 HYDROXY (VIT D DEFICIENCY, FRACTURES): Vit D, 25-Hydroxy: 49.4 ng/mL (ref 30.0–100.0)

## 2016-12-17 MED ORDER — HYDROCODONE-ACETAMINOPHEN 5-325 MG PO TABS: 1.0000 | ORAL_TABLET | Freq: Four times a day (QID) | ORAL | 0 refills | Status: DC | PRN

## 2016-12-17 MED ORDER — ENOXAPARIN SODIUM 40 MG/0.4ML ~~LOC~~ SOLN: 40.0000 mg | SUBCUTANEOUS | 0 refills | Status: DC

## 2016-12-17 MED ORDER — POTASSIUM CHLORIDE ER 20 MEQ PO TBCR: 20.0000 meq | EXTENDED_RELEASE_TABLET | Freq: Every day | ORAL | 0 refills | Status: DC

## 2016-12-17 MED ORDER — OXYCODONE HCL 5 MG PO TABS: 5.0000 mg | ORAL_TABLET | ORAL | 0 refills | Status: DC | PRN

## 2016-12-17 MED ORDER — LEVETIRACETAM 500 MG PO TABS: 500.0000 mg | ORAL_TABLET | Freq: Two times a day (BID) | ORAL | 1 refills | Status: DC

## 2016-12-17 NOTE — Progress Notes (Signed)
   Subjective:  Patient reports pain as mild.    Objective:   VITALS:   Vitals:   12/16/16 1808 12/16/16 2050 12/16/16 2141 12/17/16 0409  BP:   (!) 145/83 (!) 154/67  Pulse:   (!) 108 92  Resp:   16 17  Temp:   98.5 F (36.9 C) 99.1 F (37.3 C)  TempSrc:   Oral   SpO2: 97% 91% 92% 93%  Weight:      Height:        Neurologically intact Neurovascular intact Sensation intact distally Intact pulses distally Dorsiflexion/Plantar flexion intact Incision: dressing C/D/I and no drainage No cellulitis present Compartment soft   Lab Results  Component Value Date   WBC 6.7 12/17/2016   HGB 7.9 (L) 12/17/2016   HCT 25.1 (L) 12/17/2016   MCV 89.6 12/17/2016   PLT 199 12/17/2016     Assessment/Plan:  2 Days Post-Op   - Expected postop acute blood loss anemia - consider transfusing 1 unit of RBCs - Up with PT/OT - DVT ppx - SCDs, ambulation, lovenox - WBAT operative extremity  Glee Arvin 12/17/2016, 7:32 AM [phone]

## 2016-12-17 NOTE — NC FL2 (Signed)
[place] MEDICAID FL2 LEVEL OF CARE SCREENING TOOL     IDENTIFICATION  Patient Name: Sandra Rice Birthdate: 12/19/1955 Sex: female Admission Date (Current Location): 12/14/2016  [facility] and [place] Number:  Producer, television/film/video and Address:  [facility] [place], [address] [place], [place] [address]      Provider Number: [id number]  Attending Physician Name and Address:  Doneen Poisson, MD  Relative Name and Phone Number:  [facility] [place] [phone]    Current Level of Care: Hospital Recommended Level of Care: Skilled Nursing Facility Prior Approval Number:    Date Approved/Denied:   PASRR Number: [id number]  Discharge Plan: SNF    Current Diagnoses: Patient Active Problem List   Diagnosis Date Noted  . Subcapital fracture of hip, left, closed, initial encounter (HCC) 12/14/2016  . 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 Peripheral arterial disease (HCC)--aortofemoral bypass graft 2004 12/14/2016  . Renal artery stenosis (HCC) 12/14/2016  . Former smoker 12/14/2016  . Closed subcapital fracture of femur with delayed healing, left 12/14/2016  . Pressure injury of skin 12/14/2016  . COPD exacerbation (HCC) 12/12/2016  . Pleural effusion 12/12/2016  . Abnormal EKG 12/12/2016  . Thoracic aortic atherosclerosis (HCC) 12/12/2016  . COPD (chronic obstructive pulmonary disease) (HCC) 12/12/2016  . Fall 12/11/2016  . H/O: stroke 12/11/2016  . Hypertension 12/11/2016  . Hyperlipidemia 07/28/2008    Orientation RESPIRATION BLADDER Height & Weight     Self, Time, Situation, Place  Normal Incontinent Weight: 149 lb 6.4 oz (67.8 kg) Height:  5\' 2"  (157.5 cm)  BEHAVIORAL SYMPTOMS/MOOD NEUROLOGICAL BOWEL NUTRITION STATUS      Continent Diet (Regular room; thin fluids)  AMBULATORY STATUS COMMUNICATION OF NEEDS Skin   Extensive Assist Verbally PU Stage and Appropriate Care PU Stage 1 Dressing:  (Left  heel & right heel; foam dressing changed as needed) PU Stage 2 Dressing:  (right heel; foam dressing changed as needed)                   Personal Care Assistance Level of Assistance  Bathing, Feeding, Dressing Bathing Assistance: Maximum assistance Feeding assistance: Limited assistance Dressing Assistance: Maximum assistance     Functional Limitations Info  Sight, Hearing, Speech Sight Info: Adequate Hearing Info: Adequate Speech Info: Adequate    SPECIAL CARE FACTORS FREQUENCY  PT (By licensed PT), OT (By licensed OT)     PT Frequency: 5x OT Frequency: 5x            Contractures Contractures Info: Not present    Additional Factors Info  Psychotropic, Isolation Precautions, Code Status, Allergies Code Status Info: Full Allergies Info: No known allergies Psychotropic Info: citalopram (CELEXA)   Isolation Precautions Info: Contact     Current Medications (12/17/2016):  This is the current hospital active medication list Current Facility-Administered Medications  Medication Dose Route Frequency Provider Last Rate Last Dose  . acetaminophen (TYLENOL) tablet 650 mg  650 mg Oral Q6H PRN Tarry Kos, MD       Or  . acetaminophen (TYLENOL) suppository 650 mg  650 mg Rectal Q6H PRN Tarry Kos, MD      . albuterol (PROVENTIL) (2.5 MG/3ML) 0.083% nebulizer solution 2.5 mg  2.5 mg Nebulization Q4H PRN Rhetta Mura, MD      . alum & mag hydroxide-simeth (MAALOX/MYLANTA) 200-200-20 MG/5ML suspension 30 mL  30 mL Oral Q4H PRN Tarry Kos, MD      . aspirin EC  tablet 325 mg  325 mg Oral Daily Tarry Kos, MD   325 mg at 12/17/16 1043  . atorvastatin (LIPITOR) tablet 80 mg  80 mg Oral QHS Rhetta Mura, MD   80 mg at 12/16/16 2142  . calcium-vitamin D (OSCAL WITH D) 500-200 MG-UNIT per tablet 1 tablet  1 tablet Oral BID Rhetta Mura, MD   1 tablet at 12/17/16 1043  . Chlorhexidine Gluconate Cloth 2 % PADS 6 each  6 each Topical Q0600 Rhetta Mura, MD   6 each at 12/17/16 1049  . citalopram (CELEXA) tablet 10 mg  10 mg Oral Daily Rhetta Mura, MD   10 mg at 12/17/16 1044  . HYDROcodone-acetaminophen (NORCO/VICODIN) 5-325 MG per tablet 1-2 tablet  1-2 tablet Oral Q6H PRN Tarry Kos, MD   2 tablet at 12/17/16 0421  . menthol-cetylpyridinium (CEPACOL) lozenge 3 mg  1 lozenge Oral PRN Tarry Kos, MD       Or  . phenol (CHLORASEPTIC) mouth spray 1 spray  1 spray Mouth/Throat PRN Tarry Kos, MD      . methocarbamol (ROBAXIN) tablet 500 mg  500 mg Oral Q6H PRN Tarry Kos, MD   500 mg at 12/17/16 0421   Or  . methocarbamol (ROBAXIN) 500 mg in dextrose 5 % 50 mL IVPB  500 mg Intravenous Q6H PRN Tarry Kos, MD      . metoCLOPramide (REGLAN) tablet 5-10 mg  5-10 mg Oral Q8H PRN Tarry Kos, MD       Or  . metoCLOPramide (REGLAN) injection 5-10 mg  5-10 mg Intravenous Q8H PRN Tarry Kos, MD      . metoprolol tartrate (LOPRESSOR) tablet 100 mg  100 mg Oral BID Rhetta Mura, MD   100 mg at 12/17/16 1043  . mometasone-formoterol (DULERA) 200-5 MCG/ACT inhaler 2 puff  2 puff Inhalation BID Rhetta Mura, MD   2 puff at 12/17/16 0818  . morphine 4 MG/ML injection 0.52 mg  0.52 mg Intravenous Q2H PRN Rhetta Mura, MD   0.52 mg at 12/15/16 1331  . multivitamin with minerals tablet 1 tablet  1 tablet Oral Daily Rhetta Mura, MD   1 tablet at 12/17/16 1044  . mupirocin ointment (BACTROBAN) 2 % 1 application  1 application Nasal BID Rhetta Mura, MD   1 application at 12/17/16 1044  . nicotine (NICODERM CQ - dosed in mg/24 hours) patch 14 mg  14 mg Transdermal Daily Rhetta Mura, MD   14 mg at 12/17/16 1045  . nitroGLYCERIN (NITROSTAT) SL tablet 0.4 mg  0.4 mg Sublingual Q5 min PRN Rhetta Mura, MD      . ondansetron (ZOFRAN) tablet 4 mg  4 mg Oral Q6H PRN Tarry Kos, MD       Or  . ondansetron [facility]) injection 4 mg  4 mg Intravenous Q6H PRN Tarry Kos, MD       . oxyCODONE (Oxy IR/ROXICODONE) immediate release tablet 5-10 mg  5-10 mg Oral Q4H PRN Tarry Kos, MD   5 mg at 12/17/16 0839  . phenytoin (DILANTIN) ER capsule 100 mg  100 mg Oral Daily Rhetta Mura, MD   100 mg at 12/17/16 1044   And  . phenytoin (DILANTIN) ER capsule 200 mg  200 mg Oral QHS Rhetta Mura, MD   200 mg at 12/16/16 2142  . potassium chloride SA (K-DUR,KLOR-CON) CR tablet 20 mEq  20 mEq Oral Daily Rhetta Mura, MD   20 mEq at  12/17/16 1043  . ticagrelor (BRILINTA) tablet 90 mg  90 mg Oral BID John Giovanni, MD   90 mg at 12/17/16 1045  . tranexamic acid (CYKLOKAPRON) 2,000 mg in sodium chloride 0.9 % 50 mL Topical Application  2,000 mg Topical Once Tarry Kos, MD         Discharge Medications: Please see discharge summary for a list of discharge medications.  Relevant Imaging Results:  Relevant Lab Results:   Additional Information SSN: [id number]  Dominic Pea, LCSW

## 2016-12-17 NOTE — Progress Notes (Signed)
Physical Therapy Treatment Patient Details Name: Sandra Rice MRN: [id number] DOB: 05-02-1956 Today's Date: 12/17/2016    History of Present Illness Pt is a 61 y.o. female presenting after fall with L hip fracture. Now s/p prosthetic replacement for femoral neck fracture on 6/30. PMHx: CAD, GERD, HTN, MI, CVA, R hip replacement.    PT Comments    Patient progressing slowly due to pain and immobility.  She was able to take some steps with cues/encouragement.  Will benefit from SNF level rehab at d/c, but will continue skilled PT during acute stay.    Follow Up Recommendations  SNF     Equipment Recommendations  None recommended by PT    Recommendations for Other Services       Precautions / Restrictions Precautions Precautions: Fall;Posterior Hip Precaution Comments: multiple falls recently Other Brace/Splint: R wrist splint Restrictions Weight Bearing Restrictions: Yes LLE Weight Bearing: Weight bearing as tolerated    Mobility  Bed Mobility Overal bed mobility: Needs Assistance Bed Mobility: Supine to Sit     Supine to sit: Mod assist     General bed mobility comments: cues for technique, assist to bring L LE to EOB and to scoot hips and lift trunk  Transfers Overall transfer level: Needs assistance Equipment used: Rolling walker (2 wheeled) Transfers: Sit to/from Stand Sit to Stand: Mod assist;Max assist         General transfer comment: lifting assist, lowering assist, cues for precautions and hand placement  Ambulation/Gait Ambulation/Gait assistance: Mod assist Ambulation Distance (Feet): 5 Feet Assistive device: Rolling walker (2 wheeled) Gait Pattern/deviations: Step-to pattern;Antalgic;Trunk flexed;Decreased stride length     General Gait Details: assist to progress L LE, assist for balance, cues for sequence, limited due to pain   Stairs            Wheelchair Mobility    Modified Rankin (Stroke Patients Only)       Balance Overall  balance assessment: Needs assistance;History of Falls Sitting-balance support: Feet supported Sitting balance-Leahy Scale: Fair Sitting balance - Comments: able to sit EOB without assist   Standing balance support: Bilateral upper extremity supported Standing balance-Leahy Scale: Poor Standing balance comment: UE support for balance                            Cognition Arousal/Alertness: Awake/alert Behavior During Therapy: WFL for tasks assessed/performed Overall Cognitive Status: No family/caregiver present to determine baseline cognitive functioning Area of Impairment: Following commands;Problem solving;Attention                   Current Attention Level: Selective   Following Commands: Follows one step commands with increased time     Problem Solving: Slow processing;Decreased initiation;Difficulty sequencing;Requires verbal cues        Exercises Total Joint Exercises Ankle Circles/Pumps: AROM;Both;10 reps;Supine Short Arc Quad: AROM;Left;10 reps;Supine Heel Slides: AAROM;Left;5 reps;Supine Hip ABduction/ADduction: AAROM;Left;10 reps;Supine    General Comments        Pertinent Vitals/Pain Faces Pain Scale: Hurts even more Pain Location: L hip Pain Descriptors / Indicators: Aching;Operative site guarding;Grimacing Pain Intervention(s): Limited activity within patient's tolerance;Monitored during session;Repositioned    Home Living                      Prior Function            PT Goals (current goals can now be found in the care plan section) Progress towards PT goals: Progressing  toward goals    Frequency           PT Plan Current plan remains appropriate    Co-evaluation              AM-PAC PT "6 Clicks" Daily Activity  Outcome Measure  Difficulty turning over in bed (including adjusting bedclothes, sheets and blankets)?: Total Difficulty moving from lying on back to sitting on the side of the bed? :  Total Difficulty sitting down on and standing up from a chair with arms (e.g., wheelchair, bedside commode, etc,.)?: Total Help needed moving to and from a bed to chair (including a wheelchair)?: A Lot Help needed walking in hospital room?: A Lot Help needed climbing 3-5 steps with a railing? : Total 6 Click Score: 8    End of Session Equipment Utilized During Treatment: Gait belt Activity Tolerance: Patient limited by pain Patient left: in chair;with call bell/phone within reach;with chair alarm set Nurse Communication: Mobility status PT Visit Diagnosis: Difficulty in walking, not elsewhere classified (R26.2);Pain;History of falling (Z91.81) Pain - Right/Left: Left Pain - part of body: Hip     Time: 1410-3013 PT Time Calculation (min) (ACUTE ONLY): 26 min  Charges:  $Gait Training: 8-22 mins $Therapeutic Exercise: 8-22 mins                    G CodesSheran Rice, [place] [phone] 12/17/2016    Sandra Rice 12/17/2016, 10:14 AM

## 2016-12-17 NOTE — Anesthesia Postprocedure Evaluation (Signed)
Anesthesia Post Note  Patient: Sandra Rice  Procedure(s) Performed: Procedure(s) (LRB): ANTERIOR APPROACH HEMI HIP ARTHROPLASTY (Left)     Patient location during evaluation: PACU Anesthesia Type: General Level of consciousness: awake Pain management: pain level controlled Vital Signs Assessment: post-procedure vital signs reviewed and stable Respiratory status: spontaneous breathing, nonlabored ventilation, respiratory function stable and patient connected to nasal cannula oxygen Cardiovascular status: blood pressure returned to baseline and stable Postop Assessment: no signs of nausea or vomiting Anesthetic complications: no    Last Vitals:  Vitals:   12/16/16 2141 12/17/16 0409  BP: (!) 145/83 (!) 154/67  Pulse: (!) 108 92  Resp: 16 17  Temp: 36.9 C 37.3 C    Last Pain:  Vitals:   12/17/16 0521  TempSrc:   PainSc: Asleep                 Tyechia Allmendinger

## 2016-12-17 NOTE — Progress Notes (Signed)
Internal Medicine Attending  Date: 12/17/2016  Patient name: Sandra Rice Medical record number: [id number] Date of birth: 1956-05-10 Age: 61 y.o. Gender: female  I saw and evaluated the patient. I reviewed the resident's note by Dr. Crista Elliot and I agree with the resident's findings and plans as documented in his progress note with the following correction:  When seen on rounds this morning she was slow to arouse initially but became more interactive as time went on and I would say that her mental status was at baseline compared to yesterday. Physical exam was notable for some bruising posterior to the left hip incision with minimal edema and no evidence of drainage or cellulitis. She is not on oxygen therapy as of this morning and therefore can be transferred to the skilled nursing facility without supplemental oxygen. We are discussing with nursing the need for the Foley catheter and if a compelling need is not existent we will discontinue that to avoid the development of a urinary tract infection. At this point, she is ready for transfer to the skilled nursing facility to continue with rehabilitation after her hip replacement.

## 2016-12-17 NOTE — Progress Notes (Signed)
Pt was transferred to [facility] center via [place]. Left hip dressing dry and intact. Report given to [facility].

## 2016-12-17 NOTE — Clinical Social Work Note (Addendum)
Pt is ready for discharge today and will return to [facility]. Pt and dtr-Angie [place] are aware and agreeable to discharge plan. CSW sent clinicals to [facility] and communicated with Clydie Braun (admissions) to confirm pt's return. CSW added room and report to treatment team sticky note and RN aware. Transportation arranged with [facility].   Per RN Marissa, Dr. is having difficulty printing prescription. [facility] put on hold. Awaiting confirmation of Rx to call [facility].   Signed script added to chart. CSW called back [facility] to arrange transport. CSW signing off as no further Social Work needs identified.   Corlis Hove, Theresia Majors, [facility] Clinical Social Worker  [phone]

## 2016-12-17 NOTE — Progress Notes (Signed)
Subjective: Patient was lying in bed sleeping upon entering the room. She was difficult to wake today but did not complain of issues other than moderate left hip pain. She denied nausea, vomiting, headache, chest pain or abdominal pain. She was in agreement with the plan to discharge to a skilled nursing facility today and denied questions or concerns on multiple occassions. She stated that she felt about the same as yesterday other than the leg pain.   Objective:  Vital signs in last 24 hours: Vitals:   12/16/16 2050 12/16/16 2141 12/17/16 0409 12/17/16 0818  BP:  (!) 145/83 (!) 154/67   Pulse:  (!) 108 92   Resp:  16 17   Temp:  98.5 F (36.9 C) 99.1 F (37.3 C)   TempSrc:  Oral    SpO2: 91% 92% 93% 92%  Weight:      Height:       Review of Systems  Constitutional: Negative for chills, diaphoresis and fever.  Respiratory: Negative for cough.   Cardiovascular: Negative for chest pain and palpitations.  Gastrointestinal: Negative for abdominal pain and nausea.  Neurological: Positive for headaches.   Physical Exam  Constitutional: She appears well-developed and well-nourished.  HENT:  Notable nonbleeding injury on the left middle parietal region of her skull approximately 6-8 cm angled up from the left ear that appeared to be in the mid stage of healing. She stated that it was from the fall.   Eyes: Pupils are equal, round, and reactive to light. Right eye exhibits discharge.  Neck: Neck supple.  Cardiovascular: Normal rate, regular rhythm and normal heart sounds.  Exam reveals no friction rub.   No murmur heard. Pulmonary/Chest: No respiratory distress. She has wheezes (Late end expiratory wheeze on the right).  Abdominal: Soft. Bowel sounds are normal. She exhibits no distension. There is no tenderness.  Neurological: She is alert.  Skin: Skin is warm and dry.  Multiple bruises in approximately the same stage of healing on her arms, hip and leg. Patient stated that the  bruising was the result of her falls.   Psychiatric: She has a normal mood and affect.    Assessment/Plan:  Principal Problem:   Subcapital fracture of hip, left, closed, initial encounter [facility]) Active Problems:   Fall   Thoracic aortic atherosclerosis (HCC)   COPD (chronic obstructive pulmonary disease) (HCC)   Cognitive deficit due to multiple acute subcortical strokes (HCC)   Closed subcapital fracture of femur with delayed healing, left   Pressure injury of skin  Closed subcapital fracture of femur with delayed healing post/op day 1: -Continue Vit D3/calcium supplement. Will discuss altering her seizure treatment w/ neuro.  Continue Norco 5/325 q6 PRN for pain Continue Surgical site care  Fall risk: Continue to keep the patient clear of lines and sheet to decrease he fall risk.   Thoracic aortic atherosclerosis: -Continue atorvostatin, 80mg  daily, metoprolol tartrate 100mg  BID, ticagrelor 90mg  tablet, nitroglycerin 0.4mg  sublingual PRN, ASA 325 daily  COPD:  Continue albuterol nebs, inhaler BID and nicotine patch. Keep O2 at 1L unless sats drop below 90% then titrate upward by 1L increments until patient is above 90%. Please do not exceed the level necessary to keep her just above the cut off as the plan is to ween the patient as soon as it is no longer needed.   Pressure Injury:  Continue keep heals off of bed and encourage mobilization. Physical therapy consult pending  Dispo: Anticipated discharge in approximately today.  Lanelle Bal,  MD Internal Medicine, PGY-1 Pager # [phone]

## 2016-12-31 ENCOUNTER — Ambulatory Visit (INDEPENDENT_AMBULATORY_CARE_PROVIDER_SITE_OTHER): Payer: Medicare Other | Admitting: Orthopaedic Surgery

## 2016-12-31 ENCOUNTER — Encounter (INDEPENDENT_AMBULATORY_CARE_PROVIDER_SITE_OTHER): Payer: Self-pay | Admitting: Orthopaedic Surgery

## 2016-12-31 ENCOUNTER — Ambulatory Visit (INDEPENDENT_AMBULATORY_CARE_PROVIDER_SITE_OTHER): Payer: Medicare Other

## 2016-12-31 DIAGNOSIS — S72012A Unspecified intracapsular fracture of left femur, initial encounter for closed fracture: Secondary | ICD-10-CM

## 2016-12-31 NOTE — Progress Notes (Signed)
Skarlette is 2 weeks status post left hip hemiarthroplasty for femoral neck fracture. She is working with physical therapy and occupational therapy. Overall she is doing well. She is ambulating mainly in a wheelchair. Her surgical incision has healed. There is no signs of infection. Her x-ray show stable alignment. At this point continue with physical therapy for rehabilitation and strengthening mobilization. May discontinue Lovenox and just take aspirin daily.  Follow-up in 4 weeks repeat AP pelvis

## 2017-02-01 ENCOUNTER — Ambulatory Visit (INDEPENDENT_AMBULATORY_CARE_PROVIDER_SITE_OTHER): Payer: Medicare Other

## 2017-02-01 ENCOUNTER — Encounter (INDEPENDENT_AMBULATORY_CARE_PROVIDER_SITE_OTHER): Payer: Self-pay | Admitting: Orthopaedic Surgery

## 2017-02-01 ENCOUNTER — Ambulatory Visit (INDEPENDENT_AMBULATORY_CARE_PROVIDER_SITE_OTHER): Payer: Medicaid Other | Admitting: Orthopaedic Surgery

## 2017-02-01 DIAGNOSIS — S72012A Unspecified intracapsular fracture of left femur, initial encounter for closed fracture: Secondary | ICD-10-CM | POA: Diagnosis not present

## 2017-02-01 NOTE — Progress Notes (Signed)
Patient is 6 weeks status post left partial hip replacement. She is at [facility]. Overall she is doing well. She just mainly has pain when she tries to ambulate. Her x-ray show stable alignment of the partial hip replacement. Her surgical scars fully healed. She has painless range of motion of her hip. Her pain sounds like it's related to muscular weakness and deconditioning. Continue with aggressive physical therapy for strengthening and mobilization. Follow-up in 2 months for recheck.

## 2017-04-01 ENCOUNTER — Ambulatory Visit (INDEPENDENT_AMBULATORY_CARE_PROVIDER_SITE_OTHER): Payer: Medicaid Other | Admitting: Orthopaedic Surgery

## 2019-11-26 ENCOUNTER — Other Ambulatory Visit: Payer: Self-pay | Admitting: Internal Medicine

## 2019-11-26 DIAGNOSIS — Z1231 Encounter for screening mammogram for malignant neoplasm of breast: Secondary | ICD-10-CM

## 2020-01-26 ENCOUNTER — Ambulatory Visit: Payer: Self-pay

## 2020-02-09 ENCOUNTER — Ambulatory Visit: Payer: Medicare Other

## 2020-02-18 ENCOUNTER — Other Ambulatory Visit: Payer: Self-pay

## 2020-02-18 ENCOUNTER — Ambulatory Visit
Admission: RE | Admit: 2020-02-18 | Discharge: 2020-02-18 | Disposition: A | Discharge status: Home / Self Care | Payer: Self-pay | Source: Ambulatory Visit | Attending: Internal Medicine | Admitting: Internal Medicine

## 2020-02-18 DIAGNOSIS — Z1231 Encounter for screening mammogram for malignant neoplasm of breast: Secondary | ICD-10-CM

## 2020-04-04 ENCOUNTER — Other Ambulatory Visit: Payer: Self-pay | Admitting: Internal Medicine

## 2020-04-04 DIAGNOSIS — Z1231 Encounter for screening mammogram for malignant neoplasm of breast: Secondary | ICD-10-CM

## 2020-04-04 DIAGNOSIS — Z13820 Encounter for screening for osteoporosis: Secondary | ICD-10-CM

## 2020-05-03 ENCOUNTER — Ambulatory Visit: Payer: Self-pay | Admitting: Obstetrics and Gynecology

## 2020-05-30 ENCOUNTER — Ambulatory Visit (INDEPENDENT_AMBULATORY_CARE_PROVIDER_SITE_OTHER): Payer: Medicare (Managed Care) | Admitting: Obstetrics and Gynecology

## 2020-05-30 ENCOUNTER — Other Ambulatory Visit: Payer: Self-pay

## 2020-05-30 ENCOUNTER — Encounter: Payer: Self-pay | Admitting: Obstetrics and Gynecology

## 2020-05-30 VITALS — BP 122/84 | Ht 62.0 in

## 2020-05-30 DIAGNOSIS — B372 Candidiasis of skin and nail: Secondary | ICD-10-CM

## 2020-05-30 DIAGNOSIS — Z13820 Encounter for screening for osteoporosis: Secondary | ICD-10-CM

## 2020-05-30 DIAGNOSIS — Z124 Encounter for screening for malignant neoplasm of cervix: Secondary | ICD-10-CM | POA: Diagnosis not present

## 2020-05-30 DIAGNOSIS — Z01419 Encounter for gynecological examination (general) (routine) without abnormal findings: Secondary | ICD-10-CM

## 2020-05-30 MED ORDER — NYSTATIN 100000 UNIT/GM EX POWD: 1.0000 "application " | Freq: Three times a day (TID) | CUTANEOUS | 0 refills | Status: AC

## 2020-05-30 NOTE — Progress Notes (Signed)
Sandra Rice [birth year] [id number]  SUBJECTIVE:  64 y.o. G2P2 female new patient here for a gynecologic exam and Pap smear. She has no gynecologic concerns.  Current Outpatient Medications  Medication Sig Dispense Refill  . acetaminophen (TYLENOL) 325 MG tablet Take 650 mg by mouth every 8 (eight) hours as needed. Pain/fever    . albuterol (PROVENTIL) (2.5 MG/3ML) 0.083% nebulizer solution Take 2.5 mg by nebulization every 4 (four) hours as needed for wheezing or shortness of breath.    Marland Kitchen aspirin 81 MG chewable tablet Chew 81 mg by mouth daily.    Marland Kitchen atorvastatin (LIPITOR) 80 MG tablet Take 80 mg by mouth at bedtime.    . budesonide-formoterol (SYMBICORT) 160-4.5 MCG/ACT inhaler Inhale 2 puffs into the lungs 2 (two) times daily.    . Calcium Carb-Cholecalciferol (CALCIUM-VITAMIN D) 600-400 MG-UNIT TABS Take 1 tablet by mouth 2 (two) times daily.    . citalopram (CELEXA) 10 MG tablet Take 10 mg by mouth daily.    Marland Kitchen levETIRAcetam (KEPPRA) 500 MG tablet Take 1 tablet (500 mg total) by mouth 2 (two) times daily. 60 tablet 1  . metoprolol tartrate (LOPRESSOR) 100 MG tablet Take 100 mg by mouth 2 (two) times daily.    . Multiple Vitamins-Minerals (MULTIVITAMIN WITH MINERALS) tablet Take 1 tablet by mouth daily.    . nicotine (NICODERM CQ - DOSED IN MG/24 HOURS) 14 mg/24hr patch Place 14 mg onto the skin daily.    . nitroGLYCERIN (NITROSTAT) 0.4 MG SL tablet Place 0.4 mg under the tongue every 5 (five) minutes as needed for chest pain.    Marland Kitchen oxyCODONE (OXY IR/ROXICODONE) 5 MG immediate release tablet Take 1-2 tablets (5-10 mg total) by mouth every 4 (four) hours as needed for breakthrough pain ((for MODERATE breakthrough pain)). 30 tablet 0  . phenytoin (DILANTIN) 100 MG ER capsule Take 100-200 mg by mouth See admin instructions. Take 100 mg every morning  Take 200 mg at bedtime    . Potassium Chloride ER 20 MEQ TBCR Take 20 mEq by mouth daily. 30 tablet 0  . ticagrelor (BRILINTA) 90 MG TABS tablet  Take 90 mg by mouth 2 (two) times daily.    Marland Kitchen enoxaparin (LOVENOX) 40 MG/0.4ML injection Inject 0.4 mLs (40 mg total) into the skin daily. (Patient not taking: Reported on 05/30/2020) 35 Syringe 0   No current facility-administered medications for this visit.   Allergies: Patient has no known allergies.  No LMP recorded. Patient is postmenopausal.  Past medical history,surgical history, problem list, medications, allergies, family history and social history were all reviewed and documented as reviewed in the EPIC chart.  GYN ROS: no abnormal bleeding, pelvic pain or discharge, no breast pain or new or enlarging lumps on self exam.  No dysuria, urinary frequency, pain with urination, cloudy/malodorous urine.   OBJECTIVE:  BP 122/84   Ht 5\' 2"  (1.575 m)   BMI 27.33 kg/m  The patient appears well, alert, oriented, in no distress.  BREAST EXAM: breasts appear normal, no suspicious masses, no skin or nipple changes or axillary nodes, right inframammary fold has erythema and classic signs of candidiasis  PELVIC EXAM: VULVA: normal appearing vulva with atrophic change, no masses, tenderness or lesions, VAGINA: normal appearing vagina with atrophic change, normal color and discharge, no lesions, CERVIX: atrophic cervix is a difficult to discern from surrounding vaginal tissues and appears flush with the vagina, without discharge or lesions, UTERUS: uterus is normal size, shape, consistency and nontender, ADNEXA: normal adnexa in size, nontender  and no masses, PAP: Pap smear done today, thin-prep method  Chaperone: Kennon Portela present during the examination  ASSESSMENT:  64 y.o. G2P2 here for a routine gynecologic exam  PLAN:   1. Postmenopausal.  No significant hot flashes or night sweats.  No vaginal bleeding. 2. Pap smear last many years ago.  She denies any history of abnormal Pap smears.  Pap smear cytology only is collected today. 3. Mammogram 02/2020.  Normal breast exam today.  She  will continue with annual mammograms. 4. Candidal rash in the right inframammary fold.  She indicates that she is not having any symptoms or pain from this.  I recommended that she treat this with 7 to 10 days of topical nystatin powder 2-3 times daily.  I sent her a prescription to the pharmacy.  Follow-up if symptoms do not improve. 5. Colonoscopy never.  Discussed the importance of colon cancer screening.  I do suggest that she get this scheduled and we have names and contacts available for [facility] to help facilitate with scheduling if needed. 6. DEXA never.  I encouraged her to get a baseline bone density scan scheduled. 7. Health maintenance.  No labs today as she normally has these completed elsewhere.  Return annually or sooner, prn.   Theresia Majors MD 05/30/20

## 2020-05-30 NOTE — Addendum Note (Signed)
Addended by: Dayna Barker on: 05/30/2020 03:31 PM   Modules accepted: Orders

## 2020-05-31 LAB — PAP IG W/ RFLX HPV ASCU

## 2020-07-19 ENCOUNTER — Other Ambulatory Visit: Payer: Self-pay

## 2020-07-19 ENCOUNTER — Ambulatory Visit (INDEPENDENT_AMBULATORY_CARE_PROVIDER_SITE_OTHER): Payer: Medicare (Managed Care)

## 2020-07-19 ENCOUNTER — Other Ambulatory Visit: Payer: Self-pay | Admitting: Obstetrics and Gynecology

## 2020-07-19 DIAGNOSIS — Z01419 Encounter for gynecological examination (general) (routine) without abnormal findings: Secondary | ICD-10-CM

## 2020-07-19 DIAGNOSIS — Z780 Asymptomatic menopausal state: Secondary | ICD-10-CM | POA: Diagnosis not present

## 2020-07-19 DIAGNOSIS — M810 Age-related osteoporosis without current pathological fracture: Secondary | ICD-10-CM | POA: Diagnosis not present

## 2020-07-19 DIAGNOSIS — Z13820 Encounter for screening for osteoporosis: Secondary | ICD-10-CM

## 2020-09-29 IMAGING — MG DIGITAL SCREENING BILAT W/ CAD
4 series · 4 of 4 positions shown · non-contrast
Comparison: None.

CLINICAL DATA: Screening.

EXAM:
DIGITAL SCREENING BILATERAL MAMMOGRAM WITH CAD

[L MLO]
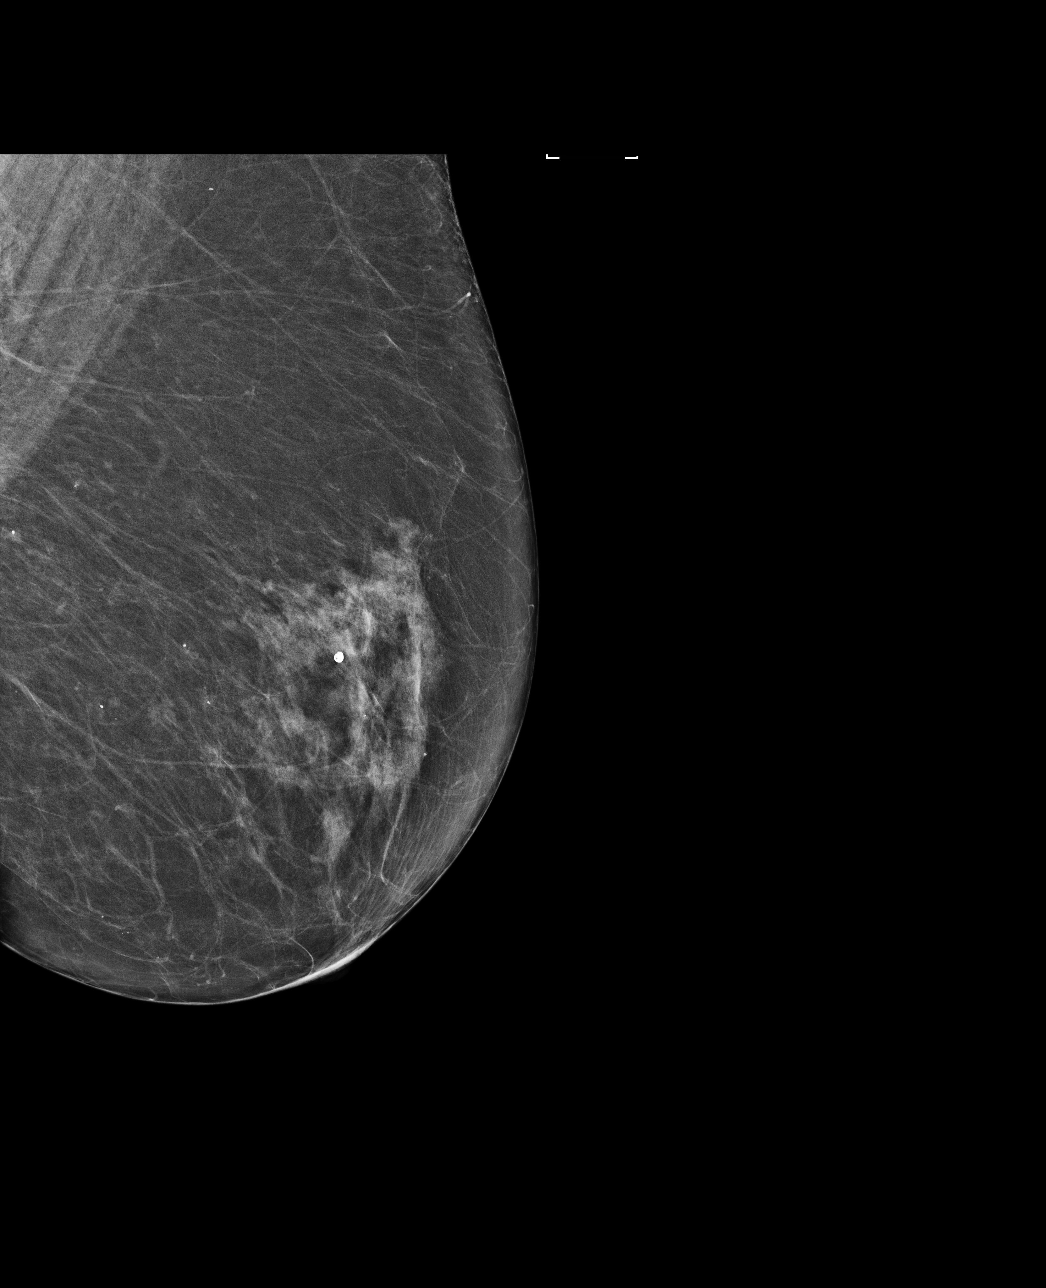

[L CC]
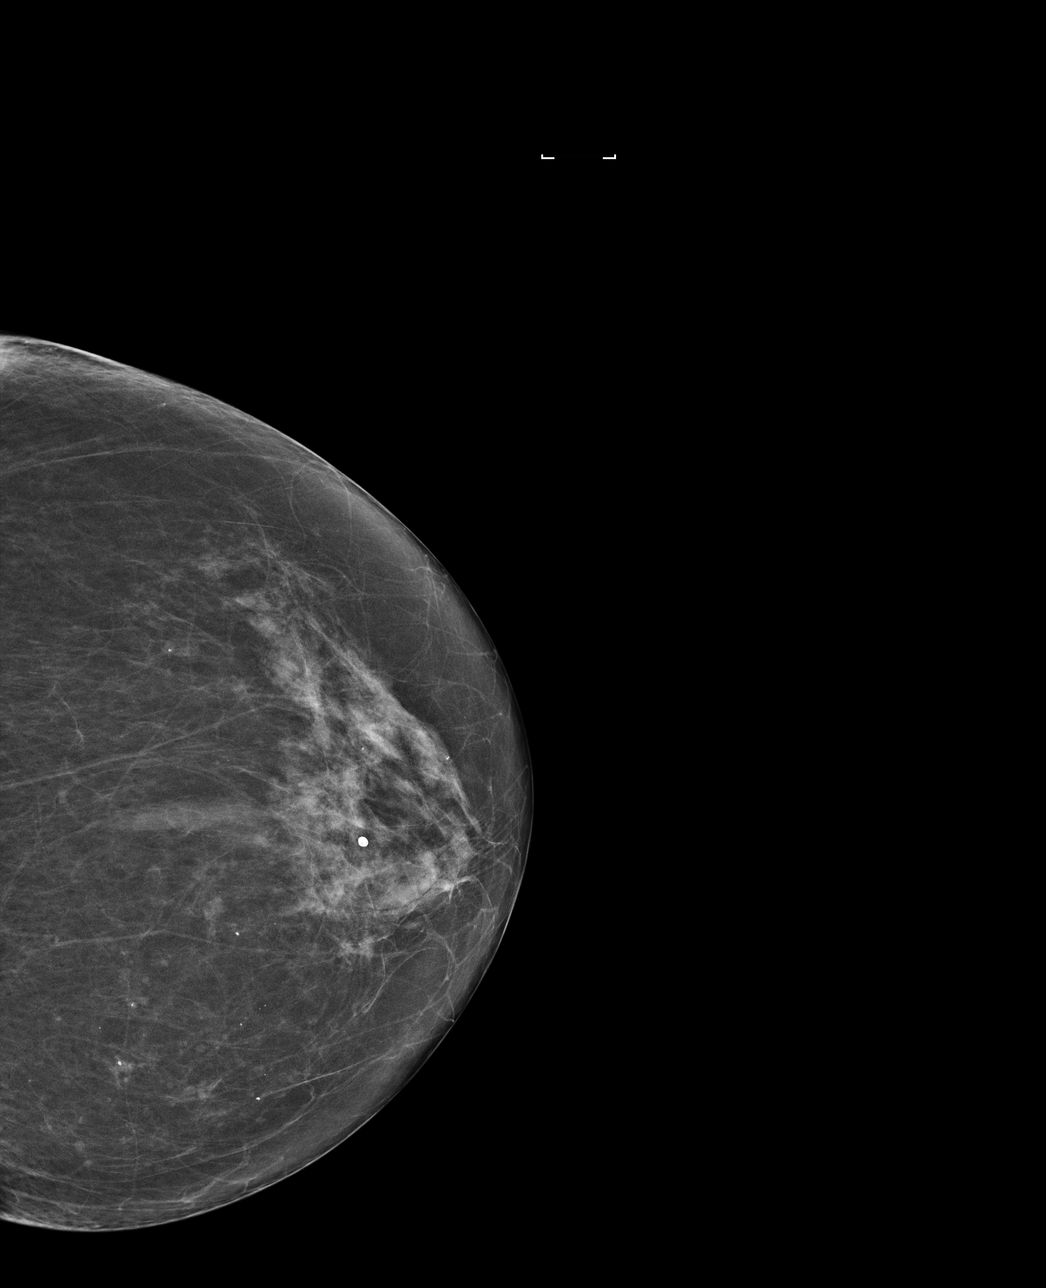

[R MLO]
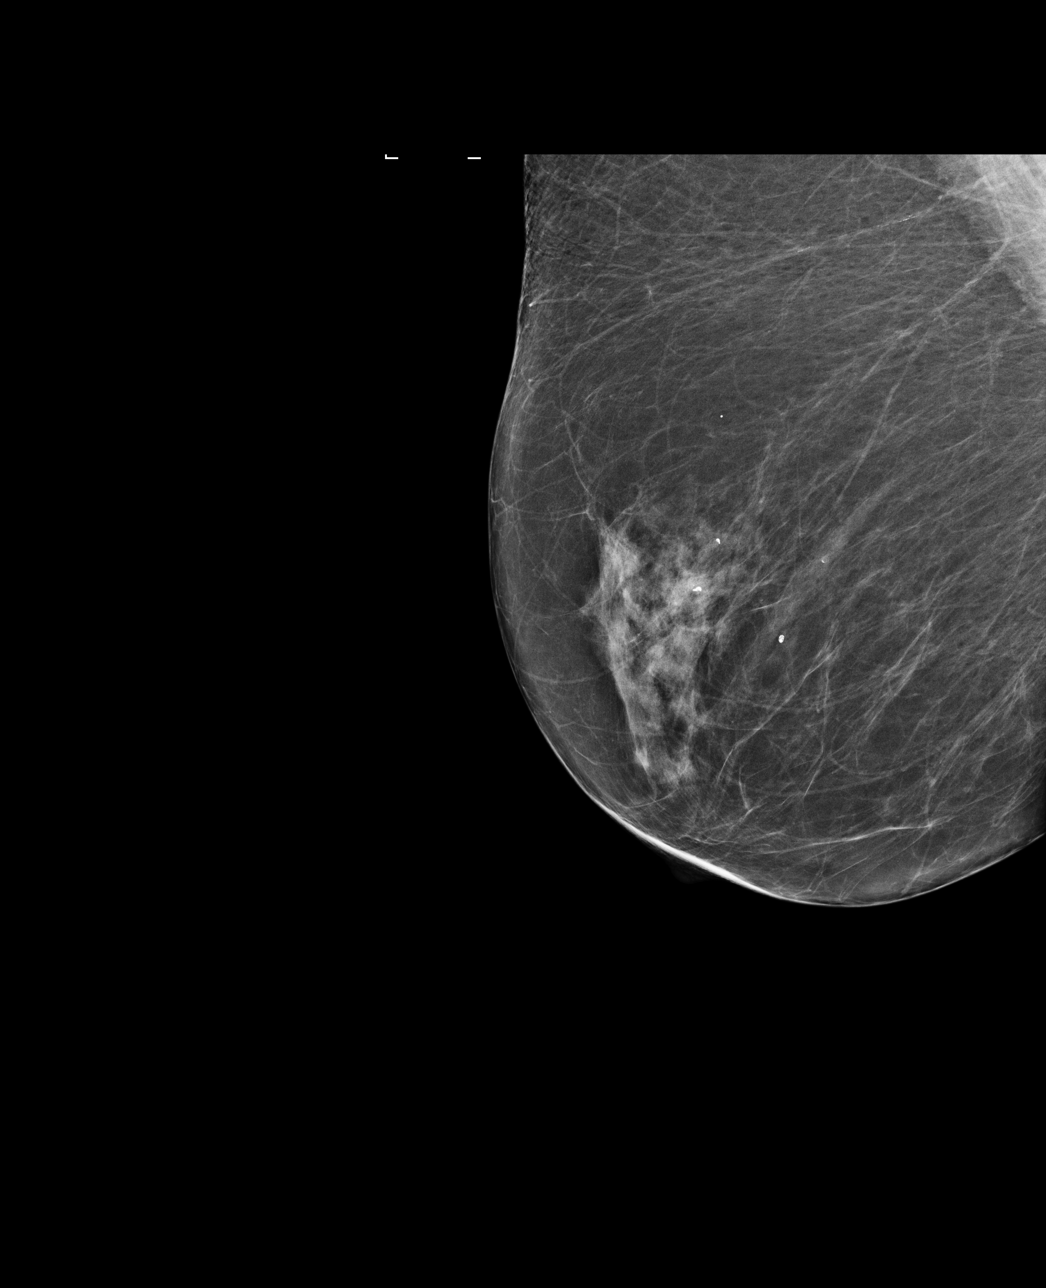

[R CC]
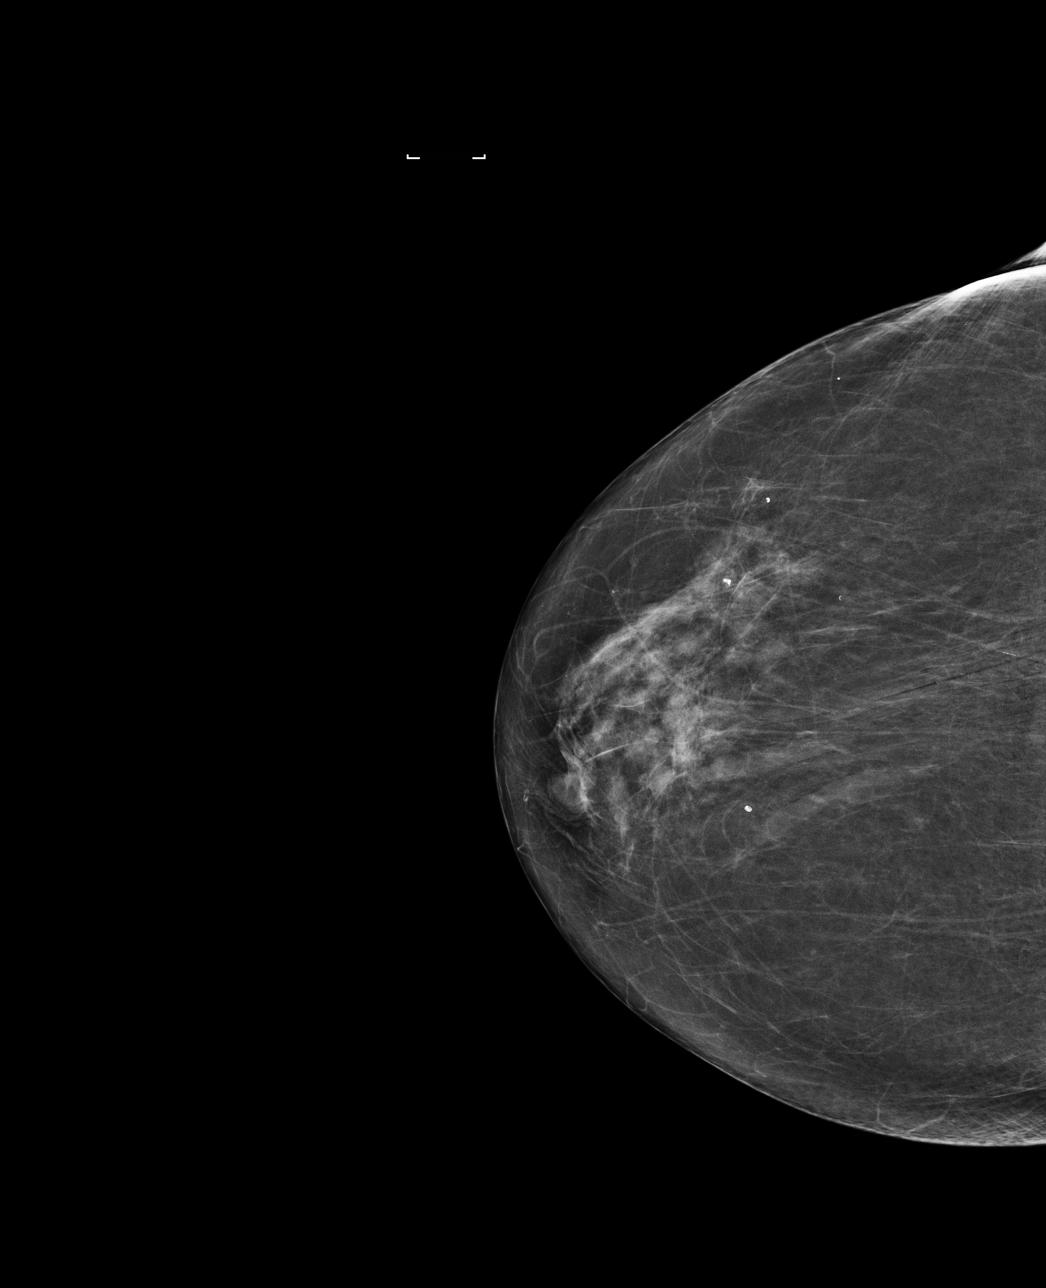

[4 of 4 positions shown; findings below may reference images not displayed]

ACR Breast Density Category b: There are scattered areas of
fibroglandular density.
FINDINGS: There are no findings suspicious for malignancy. Images were
processed with CAD.
IMPRESSION: No mammographic evidence of malignancy. A result letter of this
screening mammogram will be mailed directly to the patient.

RECOMMENDATION:
Screening mammogram in one year. (Code:[id number])

BI-RADS CATEGORY  1: Negative.

## 2021-02-21 ENCOUNTER — Ambulatory Visit: Payer: Medicare Other

## 2021-02-21 ENCOUNTER — Other Ambulatory Visit: Payer: Medicare Other

## 2022-10-17 DEATH — deceased
